# Patient Record
Sex: Female | Born: 1970 | Race: White | Hispanic: No | Marital: Single | Smoking: Never smoker
Health system: Southern US, Community
[De-identification: ages and names within clinical notes are randomized; demographics above are authoritative.]

## PROBLEM LIST (undated history)

## (undated) DIAGNOSIS — Z87442 Personal history of urinary calculi: Secondary | ICD-10-CM

## (undated) DIAGNOSIS — G43909 Migraine, unspecified, not intractable, without status migrainosus: Secondary | ICD-10-CM

## (undated) DIAGNOSIS — T8484XA Pain due to internal orthopedic prosthetic devices, implants and grafts, initial encounter: Secondary | ICD-10-CM

## (undated) DIAGNOSIS — E559 Vitamin D deficiency, unspecified: Secondary | ICD-10-CM

## (undated) DIAGNOSIS — G4730 Sleep apnea, unspecified: Secondary | ICD-10-CM

## (undated) DIAGNOSIS — E785 Hyperlipidemia, unspecified: Secondary | ICD-10-CM

## (undated) DIAGNOSIS — K219 Gastro-esophageal reflux disease without esophagitis: Secondary | ICD-10-CM

## (undated) DIAGNOSIS — S86019A Strain of unspecified Achilles tendon, initial encounter: Secondary | ICD-10-CM

## (undated) HISTORY — PX: INNER EAR SURGERY: SHX679

## (undated) HISTORY — PX: KNEE SURGERY: SHX244

## (undated) HISTORY — PX: TONSILLECTOMY: SUR1361

## (undated) HISTORY — PX: KNEE ARTHROSCOPY: SUR90

## (undated) HISTORY — DX: Migraine, unspecified, not intractable, without status migrainosus: G43.909

---
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
## 2006-07-16 DIAGNOSIS — S86019A Strain of unspecified Achilles tendon, initial encounter: Secondary | ICD-10-CM

## 2006-07-16 HISTORY — DX: Strain of unspecified achilles tendon, initial encounter: S86.019A

## 2012-06-17 DIAGNOSIS — G43109 Migraine with aura, not intractable, without status migrainosus: Secondary | ICD-10-CM | POA: Insufficient documentation

## 2012-06-17 DIAGNOSIS — Z8669 Personal history of other diseases of the nervous system and sense organs: Secondary | ICD-10-CM | POA: Insufficient documentation

## 2012-06-18 DIAGNOSIS — E785 Hyperlipidemia, unspecified: Secondary | ICD-10-CM

## 2012-06-18 HISTORY — DX: Hyperlipidemia, unspecified: E78.5

## 2013-11-13 HISTORY — PX: FOOT SURGERY: SHX648

## 2014-03-02 DIAGNOSIS — E559 Vitamin D deficiency, unspecified: Secondary | ICD-10-CM

## 2014-03-02 HISTORY — DX: Vitamin D deficiency, unspecified: E55.9

## 2015-03-30 ENCOUNTER — Other Ambulatory Visit: Payer: Self-pay | Admitting: Orthopedic Surgery

## 2015-05-18 ENCOUNTER — Encounter (HOSPITAL_BASED_OUTPATIENT_CLINIC_OR_DEPARTMENT_OTHER): Payer: Self-pay | Admitting: *Deleted

## 2015-05-19 ENCOUNTER — Ambulatory Visit (HOSPITAL_BASED_OUTPATIENT_CLINIC_OR_DEPARTMENT_OTHER): Payer: Worker's Compensation | Admitting: Certified Registered"

## 2015-05-19 ENCOUNTER — Ambulatory Visit (HOSPITAL_BASED_OUTPATIENT_CLINIC_OR_DEPARTMENT_OTHER)
Admission: RE | Admit: 2015-05-19 | Discharge: 2015-05-19 | Disposition: A | Discharge status: Home / Self Care | Payer: Worker's Compensation | Source: Ambulatory Visit | Attending: Orthopedic Surgery | Admitting: Orthopedic Surgery

## 2015-05-19 ENCOUNTER — Encounter (HOSPITAL_BASED_OUTPATIENT_CLINIC_OR_DEPARTMENT_OTHER): Payer: Self-pay | Admitting: Certified Registered"

## 2015-05-19 ENCOUNTER — Encounter (HOSPITAL_BASED_OUTPATIENT_CLINIC_OR_DEPARTMENT_OTHER)
Admission: RE | Disposition: A | Discharge status: Home / Self Care | Payer: Self-pay | Source: Ambulatory Visit | Attending: Orthopedic Surgery

## 2015-05-19 DIAGNOSIS — Z6841 Body Mass Index (BMI) 40.0 and over, adult: Secondary | ICD-10-CM | POA: Insufficient documentation

## 2015-05-19 DIAGNOSIS — T8484XA Pain due to internal orthopedic prosthetic devices, implants and grafts, initial encounter: Secondary | ICD-10-CM | POA: Insufficient documentation

## 2015-05-19 DIAGNOSIS — M25572 Pain in left ankle and joints of left foot: Secondary | ICD-10-CM

## 2015-05-19 DIAGNOSIS — Y831 Surgical operation with implant of artificial internal device as the cause of abnormal reaction of the patient, or of later complication, without mention of misadventure at the time of the procedure: Secondary | ICD-10-CM | POA: Insufficient documentation

## 2015-05-19 HISTORY — DX: Pain due to internal orthopedic prosthetic devices, implants and grafts, initial encounter: T84.84XA

## 2015-05-19 HISTORY — PX: HARDWARE REMOVAL: SHX979

## 2015-05-19 SURGERY — REMOVAL, HARDWARE
Anesthesia: General | Site: Foot | Laterality: Left

## 2015-05-19 MED ORDER — HYDROMORPHONE HCL 1 MG/ML IJ SOLN
INTRAMUSCULAR | Status: AC
  Filled 2015-05-19: qty 1

## 2015-05-19 MED ORDER — DEXAMETHASONE SODIUM PHOSPHATE 10 MG/ML IJ SOLN
INTRAMUSCULAR | Status: DC | PRN
  Administered 2015-05-19: 10 mg via INTRAVENOUS

## 2015-05-19 MED ORDER — DOCUSATE SODIUM 100 MG PO CAPS: 100.0000 mg | ORAL_CAPSULE | Freq: Two times a day (BID) | ORAL | Status: DC

## 2015-05-19 MED ORDER — CHLORHEXIDINE GLUCONATE 4 % EX LIQD: 60.0000 mL | Freq: Once | CUTANEOUS | Status: DC

## 2015-05-19 MED ORDER — PROPOFOL 10 MG/ML IV BOLUS
INTRAVENOUS | Status: DC | PRN
  Administered 2015-05-19: 200 mg via INTRAVENOUS

## 2015-05-19 MED ORDER — OXYCODONE HCL 5 MG PO TABS
ORAL_TABLET | ORAL | Status: AC
  Filled 2015-05-19: qty 1

## 2015-05-19 MED ORDER — SCOPOLAMINE 1 MG/3DAYS TD PT72: 1.0000 | MEDICATED_PATCH | Freq: Once | TRANSDERMAL | Status: DC | PRN

## 2015-05-19 MED ORDER — CEFAZOLIN SODIUM-DEXTROSE 2-3 GM-% IV SOLR
INTRAVENOUS | Status: AC
  Filled 2015-05-19: qty 50

## 2015-05-19 MED ORDER — FENTANYL CITRATE (PF) 100 MCG/2ML IJ SOLN
INTRAMUSCULAR | Status: AC
  Filled 2015-05-19: qty 4

## 2015-05-19 MED ORDER — BUPIVACAINE-EPINEPHRINE 0.5% -1:200000 IJ SOLN
INTRAMUSCULAR | Status: DC | PRN
  Administered 2015-05-19: 18 mL

## 2015-05-19 MED ORDER — DIPHENHYDRAMINE HCL 25 MG PO CAPS
25.0000 mg | ORAL_CAPSULE | Freq: Once | ORAL | Status: AC
  Administered 2015-05-19: 25 mg via ORAL

## 2015-05-19 MED ORDER — CEFAZOLIN SODIUM-DEXTROSE 2-3 GM-% IV SOLR: 2.0000 g | INTRAVENOUS | Status: DC

## 2015-05-19 MED ORDER — FENTANYL CITRATE (PF) 100 MCG/2ML IJ SOLN
50.0000 ug | INTRAMUSCULAR | Status: DC | PRN
  Administered 2015-05-19: 100 ug via INTRAVENOUS
  Administered 2015-05-19: 50 ug via INTRAVENOUS

## 2015-05-19 MED ORDER — HYDROMORPHONE HCL 1 MG/ML IJ SOLN
0.2500 mg | INTRAMUSCULAR | Status: DC | PRN
  Administered 2015-05-19 (×2): 0.5 mg via INTRAVENOUS

## 2015-05-19 MED ORDER — LIDOCAINE HCL (CARDIAC) 20 MG/ML IV SOLN
INTRAVENOUS | Status: DC | PRN
  Administered 2015-05-19: 60 mg via INTRAVENOUS

## 2015-05-19 MED ORDER — LACTATED RINGERS IV SOLN
INTRAVENOUS | Status: DC
  Administered 2015-05-19: 10:00:00 via INTRAVENOUS

## 2015-05-19 MED ORDER — CEFAZOLIN SODIUM 1-5 GM-% IV SOLN
INTRAVENOUS | Status: AC
  Filled 2015-05-19: qty 50

## 2015-05-19 MED ORDER — MIDAZOLAM HCL 2 MG/2ML IJ SOLN
1.0000 mg | INTRAMUSCULAR | Status: DC | PRN
  Administered 2015-05-19: 1 mg via INTRAVENOUS

## 2015-05-19 MED ORDER — MEPERIDINE HCL 25 MG/ML IJ SOLN: 6.2500 mg | INTRAMUSCULAR | Status: DC | PRN

## 2015-05-19 MED ORDER — SODIUM CHLORIDE 0.9 % IV SOLN: INTRAVENOUS | Status: DC

## 2015-05-19 MED ORDER — OXYCODONE HCL 5 MG/5ML PO SOLN: 5.0000 mg | Freq: Once | ORAL | Status: AC | PRN

## 2015-05-19 MED ORDER — SENNA 8.6 MG PO TABS: 2.0000 | ORAL_TABLET | Freq: Two times a day (BID) | ORAL | Status: DC

## 2015-05-19 MED ORDER — OXYCODONE HCL 5 MG PO TABS
5.0000 mg | ORAL_TABLET | Freq: Once | ORAL | Status: AC | PRN
  Administered 2015-05-19: 5 mg via ORAL

## 2015-05-19 MED ORDER — 0.9 % SODIUM CHLORIDE (POUR BTL) OPTIME
TOPICAL | Status: DC | PRN
  Administered 2015-05-19: 300 mL

## 2015-05-19 MED ORDER — MIDAZOLAM HCL 2 MG/2ML IJ SOLN
INTRAMUSCULAR | Status: AC
  Filled 2015-05-19: qty 4

## 2015-05-19 MED ORDER — HYDROCODONE-ACETAMINOPHEN 5-325 MG PO TABS: 1.0000 | ORAL_TABLET | ORAL | Status: DC | PRN

## 2015-05-19 MED ORDER — GLYCOPYRROLATE 0.2 MG/ML IJ SOLN: 0.2000 mg | Freq: Once | INTRAMUSCULAR | Status: DC | PRN

## 2015-05-19 MED ORDER — DIPHENHYDRAMINE HCL 25 MG PO CAPS
ORAL_CAPSULE | ORAL | Status: AC
  Filled 2015-05-19: qty 1

## 2015-05-19 SURGICAL SUPPLY — 65 items
APL SKNCLS STERI-STRIP NONHPOA (GAUZE/BANDAGES/DRESSINGS)
BANDAGE ELASTIC 4 VELCRO ST LF (GAUZE/BANDAGES/DRESSINGS) IMPLANT
BANDAGE ESMARK 6X9 LF (GAUZE/BANDAGES/DRESSINGS) IMPLANT
BENZOIN TINCTURE PRP APPL 2/3 (GAUZE/BANDAGES/DRESSINGS) IMPLANT
BLADE SURG 15 STRL LF DISP TIS (BLADE) ×2 IMPLANT
BLADE SURG 15 STRL SS (BLADE) ×6
BNDG CMPR 9X4 STRL LF SNTH (GAUZE/BANDAGES/DRESSINGS) ×1
BNDG CMPR 9X6 STRL LF SNTH (GAUZE/BANDAGES/DRESSINGS)
BNDG COHESIVE 4X5 TAN STRL (GAUZE/BANDAGES/DRESSINGS) ×2 IMPLANT
BNDG COHESIVE 6X5 TAN STRL LF (GAUZE/BANDAGES/DRESSINGS) IMPLANT
BNDG ESMARK 4X9 LF (GAUZE/BANDAGES/DRESSINGS) ×2 IMPLANT
BNDG ESMARK 6X9 LF (GAUZE/BANDAGES/DRESSINGS)
CHLORAPREP W/TINT 26ML (MISCELLANEOUS) ×3 IMPLANT
CLOSURE WOUND 1/2 X4 (GAUZE/BANDAGES/DRESSINGS)
COVER BACK TABLE 60X90IN (DRAPES) ×3 IMPLANT
CUFF TOURNIQUET SINGLE 34IN LL (TOURNIQUET CUFF) IMPLANT
DECANTER SPIKE VIAL GLASS SM (MISCELLANEOUS) IMPLANT
DRAPE EXTREMITY T 121X128X90 (DRAPE) ×3 IMPLANT
DRAPE OEC MINIVIEW 54X84 (DRAPES) ×2 IMPLANT
DRAPE SURG 17X23 STRL (DRAPES) IMPLANT
DRAPE U-SHAPE 47X51 STRL (DRAPES) ×3 IMPLANT
DRSG MEPITEL 4X7.2 (GAUZE/BANDAGES/DRESSINGS) ×2 IMPLANT
DRSG PAD ABDOMINAL 8X10 ST (GAUZE/BANDAGES/DRESSINGS) IMPLANT
ELECT REM PT RETURN 9FT ADLT (ELECTROSURGICAL) ×3
ELECTRODE REM PT RTRN 9FT ADLT (ELECTROSURGICAL) ×1 IMPLANT
GAUZE SPONGE 4X4 12PLY STRL (GAUZE/BANDAGES/DRESSINGS) ×3 IMPLANT
GLOVE BIO SURGEON STRL SZ8 (GLOVE) ×3 IMPLANT
GLOVE BIOGEL PI IND STRL 8 (GLOVE) ×2 IMPLANT
GLOVE BIOGEL PI INDICATOR 8 (GLOVE) ×4
GLOVE ECLIPSE 7.5 STRL STRAW (GLOVE) ×3 IMPLANT
GLOVE EXAM NITRILE MD LF STRL (GLOVE) IMPLANT
GOWN STRL REUS W/ TWL LRG LVL3 (GOWN DISPOSABLE) ×1 IMPLANT
GOWN STRL REUS W/ TWL XL LVL3 (GOWN DISPOSABLE) ×2 IMPLANT
GOWN STRL REUS W/TWL LRG LVL3 (GOWN DISPOSABLE) ×3
GOWN STRL REUS W/TWL XL LVL3 (GOWN DISPOSABLE) ×6
NEEDLE HYPO 22GX1.5 SAFETY (NEEDLE) ×2 IMPLANT
PACK BASIN DAY SURGERY FS (CUSTOM PROCEDURE TRAY) ×3 IMPLANT
PAD CAST 4YDX4 CTTN HI CHSV (CAST SUPPLIES) ×1 IMPLANT
PADDING CAST ABS 4INX4YD NS (CAST SUPPLIES)
PADDING CAST ABS COTTON 4X4 ST (CAST SUPPLIES) IMPLANT
PADDING CAST COTTON 4X4 STRL (CAST SUPPLIES) ×3
PADDING CAST COTTON 6X4 STRL (CAST SUPPLIES) IMPLANT
PENCIL BUTTON HOLSTER BLD 10FT (ELECTRODE) ×2 IMPLANT
PIN GUIDE DRILL TIP 2.8X300 (DRILL) ×2 IMPLANT
SANITIZER HAND PURELL 535ML FO (MISCELLANEOUS) ×3 IMPLANT
SHEET MEDIUM DRAPE 40X70 STRL (DRAPES) ×3 IMPLANT
SLEEVE SCD COMPRESS KNEE MED (MISCELLANEOUS) ×3 IMPLANT
SPLINT FAST PLASTER 5X30 (CAST SUPPLIES)
SPLINT PLASTER CAST FAST 5X30 (CAST SUPPLIES) IMPLANT
SPONGE LAP 18X18 X RAY DECT (DISPOSABLE) ×3 IMPLANT
STOCKINETTE 6  STRL (DRAPES) ×2
STOCKINETTE 6 STRL (DRAPES) ×1 IMPLANT
STRIP CLOSURE SKIN 1/2X4 (GAUZE/BANDAGES/DRESSINGS) IMPLANT
SUCTION FRAZIER TIP 10 FR DISP (SUCTIONS) IMPLANT
SUT ETHILON 3 0 PS 1 (SUTURE) ×2 IMPLANT
SUT MNCRL AB 3-0 PS2 18 (SUTURE) ×3 IMPLANT
SUT VIC AB 0 SH 27 (SUTURE) IMPLANT
SUT VIC AB 2-0 SH 27 (SUTURE)
SUT VIC AB 2-0 SH 27XBRD (SUTURE) IMPLANT
SYR BULB 3OZ (MISCELLANEOUS) ×3 IMPLANT
SYR CONTROL 10ML LL (SYRINGE) ×2 IMPLANT
TOWEL OR 17X24 6PK STRL BLUE (TOWEL DISPOSABLE) ×3 IMPLANT
TUBE CONNECTING 20'X1/4 (TUBING) ×1
TUBE CONNECTING 20X1/4 (TUBING) ×1 IMPLANT
UNDERPAD 30X30 (UNDERPADS AND DIAPERS) ×3 IMPLANT

## 2015-05-19 NOTE — Anesthesia Postprocedure Evaluation (Signed)
  Anesthesia Post-op Note  Patient: Stephanie Herrera  Procedure(s) Performed: Procedure(s): LEFT FOOT REMOVAL DEEP IMPLANT HARDWARE FROM CALCANEOUS AND FIRST METATARSAL (Left)  Patient Location: PACU  Anesthesia Type: General   Level of Consciousness: awake, alert  and oriented  Airway and Oxygen Therapy: Patient Spontanous Breathing  Post-op Pain: mild  Post-op Assessment: Post-op Vital signs reviewed  Post-op Vital Signs: Reviewed  Last Vitals:  Filed Vitals:   05/19/15 1408  BP: 147/85  Pulse: 63  Temp: 36.6 C  Resp: 16    Complications: No apparent anesthesia complications

## 2015-05-19 NOTE — H&P (Signed)
Stephanie Herrera is an 44 y.o. female.   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  She presents today for removal of the deep implants from the calcaneus and 1st MT.  Past Medical History  Diagnosis Date  . Painful orthopaedic hardware     left foot    Past Surgical History  Procedure Laterality Date  . Tonsillectomy    . Foot surgery Left 11-2013    by Dr Victorino DikeHewitt  . Knee surgery Right     History reviewed. No pertinent family history. Social History:  reports that she has never smoked. She does not have any smokeless tobacco history on file. She reports that she drinks alcohol. She reports that she does not use illicit drugs.  Allergies:  Allergies  Allergen Reactions  . Ivp Dye [Iodinated Diagnostic Agents] Hives    Medications Prior to Admission  Medication Sig Dispense Refill  . clindamycin (CLEOCIN) 150 MG capsule Take by mouth 3 (three) times daily.      No results found for this or any previous visit (from the past 48 hour(s)). No results found.  ROS  No recent f/c/n/v/wt loss  Blood pressure 156/73, pulse 60, temperature 98.1 F (36.7 C), temperature source Oral, resp. rate 18, height 5\' 2"  (1.575 m), weight 122.698 kg (270 lb 8 oz), SpO2 100 %. Physical Exam  wn wd woman in nad.  A and O x 4.  Mood and affect normal.  EOMI.  resp unlabored.  L foot with healed surgical incisions.  No lymphadenopathy.  5/5 strength in PF adn DF of the ankle.  Sens to LT intact about the foot.  Assessment/Plan L foot painful hardware - to OR for removal of deep implants from the calcaneus and 1st MT.  The risks and benefits of the alternative treatment options have been discussed in detail.  The patient wishes to proceed with surgery and specifically understands risks of bleeding, infection, nerve damage, blood clots, need for additional surgery, amputation and death.   Stephanie Herrera 05/19/2015, 11:30  AM

## 2015-05-19 NOTE — Brief Op Note (Signed)
05/19/2015  12:24 PM  PATIENT:  Stephanie Herrera  44 y.o. female  PRE-OPERATIVE DIAGNOSIS:  LEFT FOOT PAINFUL HARDWARE - calcaneus and 1st MT  POST-OPERATIVE DIAGNOSIS:  same  Procedure(s): 1.  LEFT FOOT REMOVAL DEEP IMPLANT FROM CALCANEUS 2.  Left foot removal of deep implant from the FIRST METATARSAL (separate incision) 3.  Left foot AP and lateral xrays  SURGEON:  Toni ArthursJohn Quianna Avery, MD  ASSISTANT: Alfredo MartinezJustin Ollis, PA-C  ANESTHESIA:   General  EBL:  minimal   TOURNIQUET:  approx 15 min with ankle esmarch  COMPLICATIONS:  None apparent  DISPOSITION:  Extubated, awake and stable to recovery.  DICTATION ID:  [id number]

## 2015-05-19 NOTE — Discharge Instructions (Addendum)
Stephanie Hewitt, MD °[facility] ° °Please read the following information regarding your care after surgery. ° °Medications  °You only need a prescription for the narcotic pain medicine (ex. oxycodone, Percocet, Norco).  All of the other medicines listed below are available over the counter. °X acetominophen (Tylenol) 650 mg every 4-6 hours as you need for minor pain °X hydrocodone as prescribed for moderate to severe pain °?  ° °Narcotic pain medicine (ex. oxycodone, Percocet, Vicodin) will cause constipation.  To prevent this problem, take the following medicines while you are taking any pain medicine. °X docusate sodium (Colace) 100 mg twice a day X senna (Senokot) 2 tablets twice a day ° ° °Weight Bearing °X Bear weight when you are able on your operated leg or foot in flat post-op shoe. ° ° °Cast / Splint / Dressing °X Remove your dressing 3 days after surgery and cover the incisions with dry dressings.   ° °After your dressing, cast or splint is removed; you may shower, but do not soak or scrub the wound.  Allow the water to run over it, and then gently pat it dry. ° °Swelling °It is normal for you to have swelling where you had surgery.  To reduce swelling and pain, keep your toes above your nose for at least 3 days after surgery.  It may be necessary to keep your foot or leg elevated for several weeks.  If it hurts, it should be elevated. ° °Follow Up °Call my office at [phone] when you are discharged from the hospital or surgery center to schedule an appointment to be seen two weeks after surgery. ° °Call my office at [phone] if you develop a fever >101.5° F, nausea, vomiting, bleeding from the surgical site or severe pain.   ° ° ° °Post Anesthesia Home Care Instructions ° °Activity: °Get plenty of rest for the remainder of the day. A responsible adult should stay with you for 24 hours following the procedure.  °For the next 24 hours, DO NOT: °-Drive a car °-Operate machinery °-Drink  alcoholic beverages °-Take any medication unless instructed by your physician °-Make any legal decisions or sign important papers. ° °Meals: °Start with liquid foods such as gelatin or soup. Progress to regular foods as tolerated. Avoid greasy, spicy, heavy foods. If nausea and/or vomiting occur, drink only clear liquids until the nausea and/or vomiting subsides. Call your physician if vomiting continues. ° °Special Instructions/Symptoms: °Your throat may feel dry or sore from the anesthesia or the breathing tube placed in your throat during surgery. If this causes discomfort, gargle with warm salt water. The discomfort should disappear within 24 hours. ° °If you had a scopolamine patch placed behind your ear for the management of post- operative nausea and/or vomiting: ° °1. The medication in the patch is effective for 72 hours, after which it should be removed.  Wrap patch in a tissue and discard in the trash. Wash hands thoroughly with soap and water. °2. You may remove the patch earlier than 72 hours if you experience unpleasant side effects which may include dry mouth, dizziness or visual disturbances. °3. Avoid touching the patch. Wash your hands with soap and water after contact with the patch. °  ° ° ° °

## 2015-05-19 NOTE — Transfer of Care (Signed)
Immediate Anesthesia Transfer of Care Note  Patient: Stephanie HeadsStephanie Herrera  Procedure(s) Performed: Procedure(s): LEFT FOOT REMOVAL DEEP IMPLANT HARDWARE FROM CALCANEOUS AND FIRST METATARSAL (Left)  Patient Location: PACU  Anesthesia Type:General  Level of Consciousness: awake, alert , oriented and patient cooperative  Airway & Oxygen Therapy: Patient Spontanous Breathing and Patient connected to face mask oxygen  Post-op Assessment: Report given to RN and Post -op Vital signs reviewed and stable  Post vital signs: Reviewed and stable  Last Vitals:  Filed Vitals:   05/19/15 0958  BP: 156/73  Pulse: 60  Temp: 36.7 C  Resp: 18    Complications: No apparent anesthesia complications

## 2015-05-19 NOTE — Anesthesia Procedure Notes (Signed)
Procedure Name: LMA Insertion Date/Time: 05/19/2015 11:56 AM Performed by: Marce Schartz D Pre-anesthesia Checklist: Patient identified, Emergency Drugs available, Suction available and Patient being monitored Patient Re-evaluated:Patient Re-evaluated prior to inductionOxygen Delivery Method: Circle System Utilized Preoxygenation: Pre-oxygenation with 100% oxygen Intubation Type: IV induction Ventilation: Mask ventilation without difficulty LMA: LMA inserted LMA Size: 4.0 Number of attempts: 1 Airway Equipment and Method: Bite block Placement Confirmation: positive ETCO2 Tube secured with: Tape Dental Injury: Teeth and Oropharynx as per pre-operative assessment

## 2015-05-19 NOTE — Anesthesia Preprocedure Evaluation (Addendum)
Anesthesia Evaluation  Patient identified by MRN, date of birth, ID band Patient awake    Reviewed: Allergy & Precautions, NPO status , Patient's Chart, lab work & pertinent test results  Airway Mallampati: I  TM Distance: >3 FB Neck ROM: Full    Dental  (+) Teeth Intact, Dental Advisory Given   Pulmonary  breath sounds clear to auscultation        Cardiovascular Rhythm:Regular Rate:Normal     Neuro/Psych    GI/Hepatic   Endo/Other  Morbid obesity  Renal/GU      Musculoskeletal   Abdominal   Peds  Hematology   Anesthesia Other Findings   Reproductive/Obstetrics                             Anesthesia Physical Anesthesia Plan  ASA: II  Anesthesia Plan: General   Post-op Pain Management:    Induction: Intravenous  Airway Management Planned: LMA  Additional Equipment:   Intra-op Plan:   Post-operative Plan: Extubation in OR  Informed Consent: I have reviewed the patients History and Physical, chart, labs and discussed the procedure including the risks, benefits and alternatives for the proposed anesthesia with the patient or authorized representative who has indicated his/her understanding and acceptance.   Dental advisory given  Plan Discussed with: CRNA, Anesthesiologist and Surgeon  Anesthesia Plan Comments:         Anesthesia Quick Evaluation  

## 2015-05-20 ENCOUNTER — Encounter (HOSPITAL_BASED_OUTPATIENT_CLINIC_OR_DEPARTMENT_OTHER): Payer: Self-pay | Admitting: Orthopedic Surgery

## 2015-05-20 NOTE — Op Note (Signed)
Stephanie Herrera, CERINO NO.:  [id number]  MEDICAL RECORD NO.:  [id number]  LOCATION:                                 FACILITY:  PHYSICIAN:  Toni Arthurs, MD             DATE OF BIRTH:  DATE OF PROCEDURE:  05/19/2015 DATE OF DISCHARGE:                              OPERATIVE REPORT   PREOPERATIVE DIAGNOSIS:  Left foot painful hardware of the calcaneus and the first metatarsal.  POSTOPERATIVE DIAGNOSIS:  Left foot painful hardware of the calcaneus and the first metatarsal.  PROCEDURE: 1. Left foot removal of deep implants from the calcaneus. 2. Left foot removal of deep implants from the first metatarsal     through a separate incision. 3. Left foot AP and lateral radiographs.  SURGEON:  Toni Arthurs, MD  ASSISTANT:  Alfredo Martinez, PA-C.  ANESTHESIA:  General.  ESTIMATED BLOOD LOSS:  Minimal.  TOURNIQUET TIME:  Approximately 15 minutes with an ankle Esmarch.  COMPLICATIONS:  None apparent.  DISPOSITION:  Extubated, awake, and stable to recovery.  INDICATIONS FOR PROCEDURE:  The patient is a 44 year old woman who underwent peroneal tendon debridement and repair as well as correction of the cavovarus foot deformity.  She has hardware remaining in the calcaneus and the first metatarsal that is painful.  She has failed nonoperative treatment and presents today for surgical removal of her painful deep implants.  She understands the risks and benefits, the alternative treatment options, and elects surgical treatment.  She specifically understands risks of bleeding, infection, nerve damage, blood clots, need for additional surgery, continued pain, amputation, and death.  PROCEDURE IN DETAIL:  After preoperative consent was obtained and the correct operative site was identified, the patient was brought to the operating room and placed supine on the operating table.  General anesthesia was induced.  Preoperative antibiotics were administered. Surgical time-out  was taken.  Left lower extremity was prepped and draped in standard sterile fashion.  Foot was exsanguinated and a 4-inch Esmarch tourniquet was wrapped around the ankle.  The patient's dorsal foot incision was identified.  It was opened again sharply and dissection was carried down through the subcutaneous tissue to the level of the plate.  The extensor hallucis longus and brevis tendons were protected.  The 2 small frag screws were removed followed by the 1/3rd tubular plate.  The wound was irrigated copiously and closed with Monocryl and nylon.  Attention was then turned to the posterior aspect of the heel where a transverse incision was identified.  The incision was again opened sharply and dissection was carried down to the head of the screw.  Soft tissues were cleared out, the guide pin was inserted into the screw. Screw was removed in its entirety without difficulty.  The wound was irrigated and closed with nylon.  Lateral and AP radiographs of the left foot were obtained showing complete removal of all metallic hardware. No acute injuries were noted.  The calcaneal and first metatarsal osteotomy sites appeared radiographically healed.  Sterile dressings were applied followed by well-padded compression wrap.  The tourniquet was released at approximately 15 minutes.  Marcaine 0.5% with epinephrine was infiltrated into the surgical sites  at the time of closure for postoperative pain control.  The patient was awakened from anesthesia and transported to the recovery room in stable condition.  FOLLOWUP PLAN:  The patient will be weightbearing as tolerated in her flat postop shoe.  She will follow up with me in 2 weeks for suture removal.  Alfredo MartinezJustin Ollis, PA-C was present and scrubbed for the duration of the case.  His assistance was essential in positioning the patient, prepping and draping, gaining and maintaining exposure, performing the operation, closing, and dressing the  wounds.  RADIOGRAPHS:  Lateral and Harris heel radiographs of the left foot were obtained intraoperatively.  These show interval removal of screws in the plate from the first metatarsal and the screw from the calcaneus.  No acute injuries are noted.  The osteotomies appeared healed.     Toni ArthursJohn Kryssa Risenhoover, MD     JH/MEDQ  D:  05/19/2015  T:  05/20/2015  Job:  [id number]

## 2016-03-16 DIAGNOSIS — Z975 Presence of (intrauterine) contraceptive device: Secondary | ICD-10-CM | POA: Insufficient documentation

## 2016-06-15 DIAGNOSIS — G4733 Obstructive sleep apnea (adult) (pediatric): Secondary | ICD-10-CM | POA: Insufficient documentation

## 2016-06-15 DIAGNOSIS — R29818 Other symptoms and signs involving the nervous system: Secondary | ICD-10-CM | POA: Insufficient documentation

## 2016-06-15 DIAGNOSIS — Z9989 Dependence on other enabling machines and devices: Secondary | ICD-10-CM | POA: Insufficient documentation

## 2019-03-10 ENCOUNTER — Other Ambulatory Visit: Payer: Self-pay

## 2019-03-10 ENCOUNTER — Encounter: Payer: Self-pay | Admitting: Family Medicine

## 2019-03-10 ENCOUNTER — Ambulatory Visit (INDEPENDENT_AMBULATORY_CARE_PROVIDER_SITE_OTHER): Payer: 59 | Admitting: Family Medicine

## 2019-03-10 VITALS — BP 136/68 | HR 89 | Temp 97.6°F | Ht 62.5 in | Wt 303.2 lb

## 2019-03-10 DIAGNOSIS — Z975 Presence of (intrauterine) contraceptive device: Secondary | ICD-10-CM

## 2019-03-10 DIAGNOSIS — Z8669 Personal history of other diseases of the nervous system and sense organs: Secondary | ICD-10-CM

## 2019-03-10 DIAGNOSIS — E7800 Pure hypercholesterolemia, unspecified: Secondary | ICD-10-CM

## 2019-03-10 DIAGNOSIS — R600 Localized edema: Secondary | ICD-10-CM

## 2019-03-10 DIAGNOSIS — Z23 Encounter for immunization: Secondary | ICD-10-CM | POA: Diagnosis not present

## 2019-03-10 DIAGNOSIS — E559 Vitamin D deficiency, unspecified: Secondary | ICD-10-CM | POA: Diagnosis not present

## 2019-03-10 DIAGNOSIS — R29818 Other symptoms and signs involving the nervous system: Secondary | ICD-10-CM

## 2019-03-10 NOTE — Progress Notes (Signed)
Stephanie Herrera is a 48 y.o. female is here for a new patient visit. She has a number of chronic medical conditions made worse by weight gain. See below for discussion.  No care team member to display   History of Present Illness:   Weight History When did you first notice that you were gaining weight? mid adult years. Did you ever gain more than 20 pounds in less than 3 months? Yes.  Life events associated with weight gain:  Father passed  []   Marriage  []   Divorce  []   Pregnancy  []   Abuse    [x]   Illness  []   Travel  []   Injury  []   Nightshift work       [x]   Job change []   Quitting smoking []   Alcohol  []   Drugs   []   Medication   Previous weight-Herrera programs: [x]   Weight Watchers [x]   Nutrisystem   []   Stephanie Herrera  [x]   Stephanie Herrera  [x]   Atkins       [x]   [employer] Herrera      []   Zone diet  []   Medifast       []   Dash diet      []   Paleo diet      [x]   HCG diet     []   Mediterranean diet  []   Ornish diet      []   Other:   What was your maximum weight Herrera? 35lbs  What are your greatest challenges with dieting? Sticking to it  Have you ever taken medication to lose weight?  [x]   Phentermine (Adipex) []   Meridia    []   Xenecal/Alli []   Phen/Fen       []   Phendimetrazine (Bontril)  [x]   Topamax  []   Saxenda   []   Diethylpropion       []   Bupropion (Wellbutrin)      []   Belviq        []   Qsymia         []   Contrave []   Other (including supplements):   Nutritional History How often do you eat breakfast? 3 days  Number of times you eat per day: five times  What beverages do you drink? Coffee water and juice Do you get up at night to eat? no List any food intolerances/restrictions: none    Food triggers (check all that apply): [x]   Stress [x]   Boredom  [x]   Anger [x]   Insomnia   [x]   Seeking reward  [x]   Parties [x]   Eating out   []   Other:   Food cravings:  []   Sugar []   Chocolate  [x]   Starches []   Salty []   Fast food []   High fat []   Large portion []    Favorite  foods:   Medical History How many minutes per day do you exercise? 20-45 min daily  How many days per week do you exercise? 4 days  Exercise type: walk at home videos  Work-related physical activity: no  Does anything limit you from exercising? Weight gain, left ankle surgery in past   How many hours do you sleep per night? 7-8 Do you feel rested in the morning? Sometimes but not normally.   Have you ever been diagnosed with an eating disorder? no []   Anorexia Nervosa  []   Bulimia Nervosa   []   Eating Disorder NOS []   Binge Eating Disorder []   Night Eating Syndrome  System Review  []   Acne    []   Skin rash    []   Cough [x]   Snoring    []   Shortness of breath  []   Chest pain []   Difficulty breathing when flat []   Fainting/Blacking out  []   Palpitations [x]   Swelling ankles/extremities []   Abdominal pain   []   Bloating []   Constipation   []   Diarrhea    []   Food intolerance []   Dysphagia/difficulty swallowing  []   Indigestion    []   Nausea/vomiting [x]   Increased appetite   []   Decreased appetite  [x]   Heartburn []   Gas and bloating   []   Urinary frequency/urgency []   Slow urine flow []   Nighttime urination   []   Blood in stools   [x]   Back pain (upper) [x]   Back pain (lower)   []   Joint pain    []   Muscle aches/pain []   Dizziness    []   Headaches   []   Seizures []   Weakness/low energy  []   Anxiety    []   Depression []   Insomnia    []   Inability to concentrate  []   Memory Herrera []   Mood changes   []   Nervousness   []   Herrera of interest []   Cold intolerance   []   Excessive sweating  []   Hair changes []   Heat intolerance   []   Blood clots    []   Fatigue/tiredness  Women only [x]   Absence of periods  [x]   Hot flashes   [x]   Facial hair []   Abnormal/excessive menstruation  Men only []   Erectile dysfunction   []   Herrera of libido    []   Hypogonadism  PMHx, SurgHx, SocialHx, Medications, and Allergies were reviewed.   Past Medical History:  Diagnosis Date  . Achilles rupture, near  complete, right 2008  . Avitaminosis D 03/02/2014  . HLD (hyperlipidemia) 06/18/2012  . Migraine   . Painful orthopaedic hardware ([facility]) removal, left foot     Past Surgical History:  Procedure Laterality Date  . FOOT SURGERY Left 11/2013   Dr Doran Durand, tendon repair and lowered arch  . HARDWARE REMOVAL Left 05/19/2015   Procedure: LEFT FOOT REMOVAL DEEP IMPLANT HARDWARE FROM CALCANEOUS AND FIRST METATARSAL;  Surgeon: Wylene Simmer, MD;  Location: [facility];  Service: Orthopedics;  Laterality: Left;  . KNEE ARTHROSCOPY Right   . TONSILLECTOMY      Family History  Problem Relation Age of Onset  . Hyperlipidemia Mother   . Lung cancer Father   . Hemophilia Father   . HIV Father        from transfusion    Current Medications and Allergies:   Current Outpatient Medications:  .  hydrochlorothiazide (HYDRODIURIL) 25 MG tablet, TAKE 1 TABLET BY MOUTH DAILY AS NEEDED, Disp: , Rfl:  .  rizatriptan (MAXALT-MLT) 5 MG disintegrating tablet, TAKE 1 TAB AT ONSET OF MIGRAINE MAY REPEAT EVERY 2 HRS. NO MORE THAN 3 TABS IN 24 HOURS, Disp: , Rfl:    Allergies  Allergen Reactions  . Hydrocodone Itching    She is able to tolerate hydromorphone   . Iodinated Diagnostic Agents Hives    Rash, Swelling, Difficulty Breathing  . Simvastatin     Jaundice   Vitals:   Vitals:   03/10/19 0741  BP: 136/68  Pulse: 89  Temp: 97.6 F (36.4 C)  TempSrc: Oral  SpO2: 98%  Weight: (!) 303 lb 3.2 oz (137.5 kg)  Height: 5' 2.5" (1.588 m)  Body mass index is 54.57 kg/m. Ideal body weight: 51.3 kg (112 lb 15.8 oz) Adjusted ideal body weight: 85.8 kg (189 lb 1.1 oz)  Physical Exam:   Physical Exam Vitals signs and nursing note reviewed.  Constitutional:      Appearance: She is obese.  HENT:     Head: Normocephalic and atraumatic.     Nose: Nose normal.     Mouth/Throat:     Mouth: Mucous membranes are moist.  Eyes:     Extraocular Movements: Extraocular movements intact.      Conjunctiva/sclera: Conjunctivae normal.  Neck:     Musculoskeletal: Normal range of motion and neck supple.  Cardiovascular:     Rate and Rhythm: Normal rate and regular rhythm.     Heart sounds: Normal heart sounds.  Pulmonary:     Effort: Pulmonary effort is normal.  Abdominal:     Palpations: Abdomen is soft.  Skin:    General: Skin is warm.     Capillary Refill: Capillary refill takes less than 2 seconds.  Neurological:     General: No focal deficit present.  Psychiatric:        Mood and Affect: Mood normal.        Behavior: Behavior normal.        Thought Content: Thought content normal.    No flowsheet data found. No results found for: CHOL, HDL, LDLCALC, LDLDIRECT, TRIG, CHOLHDL No results found for: CREATININE, BUN, NA, K, CL, CO2 No results found for: ALT, AST, GGT, ALKPHOS, BILITOT No results found for: TSH No results found for: HGBA1C No results found for: IRON, TIBC, FERRITIN No results found for: VITAMINB12 No results found for: INSULIN  Assessment and Plan:   Natallie was seen today for weight Herrera.  Diagnoses and all orders for this visit:  Pure hypercholesterolemia  Obesity, morbid, BMI 50 or higher (HCC) -     Amb Referral to Bariatric Surgery  Need for immunization against influenza -     Flu Vaccine QUAD 36+ mos IM  IUD (intrauterine device) in place  Avitaminosis D  History of migraine headaches  Suspected sleep apnea  Bilateral edema of lower extremity   I have reviewed the abnormal BMI results with the patient. In response to these results, we have agreed to the following plan:   []   Healthy weight Herrera goal of 1-2 lbs per week        [x]   Exercise options and recommendations  []   Diet options and daily caloric recommendations  [x]   Limiting high-calorie drinks  [x]   Limiting "junk food"  []   Diet diary [x]   Stress management  []   Involving friends/family in healthy lifestyle changes  []   Community weight Herrera programs  []    Nutrition consultation  []   Weight Herrera medications  [x]   Weight Herrera surgery consultation  []   Comorbidities and long-term effects of obesity  []   Smoking cessation  []   Handout given

## 2019-03-11 ENCOUNTER — Encounter: Payer: Self-pay | Admitting: Family Medicine

## 2019-03-11 NOTE — Telephone Encounter (Signed)
Added from another message.  Ok finally got some that helped and they sent me this   [email or web address]

## 2019-03-11 NOTE — Telephone Encounter (Signed)
Called patient let know that office will check insurance and she should get call from them in the next few days.

## 2019-03-16 ENCOUNTER — Encounter: Payer: Self-pay | Admitting: Family Medicine

## 2019-03-16 DIAGNOSIS — R600 Localized edema: Secondary | ICD-10-CM | POA: Insufficient documentation

## 2019-05-12 ENCOUNTER — Other Ambulatory Visit: Payer: Self-pay | Admitting: General Surgery

## 2019-05-12 ENCOUNTER — Other Ambulatory Visit (HOSPITAL_COMMUNITY): Payer: Self-pay | Admitting: General Surgery

## 2019-05-12 DIAGNOSIS — Z9189 Other specified personal risk factors, not elsewhere classified: Secondary | ICD-10-CM

## 2019-05-28 ENCOUNTER — Ambulatory Visit (HOSPITAL_COMMUNITY)
Admission: RE | Admit: 2019-05-28 | Discharge: 2019-05-28 | Disposition: A | Discharge status: Home / Self Care | Payer: 59 | Source: Ambulatory Visit | Attending: General Surgery | Admitting: General Surgery

## 2019-05-28 ENCOUNTER — Other Ambulatory Visit: Payer: Self-pay

## 2019-05-28 ENCOUNTER — Encounter: Payer: Self-pay | Admitting: Neurology

## 2019-05-28 ENCOUNTER — Ambulatory Visit (INDEPENDENT_AMBULATORY_CARE_PROVIDER_SITE_OTHER): Payer: 59 | Admitting: Neurology

## 2019-05-28 ENCOUNTER — Other Ambulatory Visit (HOSPITAL_COMMUNITY): Payer: Self-pay | Admitting: General Surgery

## 2019-05-28 VITALS — BP 110/80 | HR 88 | Temp 97.5°F | Ht 62.5 in | Wt 306.0 lb

## 2019-05-28 DIAGNOSIS — R519 Headache, unspecified: Secondary | ICD-10-CM

## 2019-05-28 DIAGNOSIS — Z9189 Other specified personal risk factors, not elsewhere classified: Secondary | ICD-10-CM | POA: Insufficient documentation

## 2019-05-28 DIAGNOSIS — G4719 Other hypersomnia: Secondary | ICD-10-CM

## 2019-05-28 DIAGNOSIS — R0683 Snoring: Secondary | ICD-10-CM

## 2019-05-28 DIAGNOSIS — R351 Nocturia: Secondary | ICD-10-CM

## 2019-05-28 DIAGNOSIS — Z6841 Body Mass Index (BMI) 40.0 and over, adult: Secondary | ICD-10-CM

## 2019-05-28 NOTE — Patient Instructions (Signed)

## 2019-05-28 NOTE — Progress Notes (Signed)
Subjective:    Patient ID: Stephanie HeadsStephanie Herrera is a 48 y.o. female.  HPI     Huston FoleySaima Salaam Battershell, MD, PhD [facility] [address], [address] [address] [place], [place] [id number]  Dear Dr. Andrey CampanileWilson, I saw your patient, Stephanie Herrera, upon your kind request in my sleep clinic today for initial consultation of her sleep disorder, in particular, concern for underlying obstructive sleep apnea. The patient is unaccompanied today. As you know, Stephanie Herrera is a 48 year old right-handed woman with an underlying medical history of migraine headaches, hyperlipidemia, vitamin D deficiency, status post right knee surgery, status post left foot surgery, and morbid obesity with a BMI of over 50, who reports snoring and excessive daytime somnolence. I reviewed your office note from 05/07/2019, which you kindly included. She is being evaluated for bariatric surgery.  Her Epworth sleepiness score is 12 out of 24 today, fatigue severity score is 49 out of 63.  She lives with her fianc.  She has 1 child, 48 yo son.  She is a non-smoker and drinks alcohol occasionally, once or twice a month, caffeine daily in the form of coffee, up to 3 or 4/day on average, she admits that she does not drink a whole lot of fluids.  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  She has been working from home since before COVID-19.  She does not wake up rested.  She tries to be in bed between 8 and 9 and rise time is around 7.  Nevertheless, she is tired during the day, she wakes up in the middle of the night sometimes with a sharp headache and sometimes in the morning.  The headache is short-lived.  She had an eye examination in February 2020 and is due next February.  She has a dog in the household, is not aware of any family history of OSA.  Had a tonsillectomy and adenoidectomy as a child as she snored loudly.  Her Past Medical History Is Significant For: Past  Medical History:  Diagnosis Date  . 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 Avitaminosis D 03/02/2014  . HLD (hyperlipidemia) 06/18/2012  . Migraine   . Painful orthopaedic hardware (HCC) removal, left foot     Her Past Surgical History Is Significant For: Past Surgical History:  Procedure Laterality Date  . FOOT SURGERY Left 11/2013   Dr Victorino DikeHewitt, tendon repair and lowered arch  . HARDWARE REMOVAL Left 05/19/2015   Procedure: LEFT FOOT REMOVAL DEEP IMPLANT HARDWARE FROM CALCANEOUS AND FIRST METATARSAL;  Surgeon: Toni ArthursJohn Hewitt, MD;  Location: [facility];  Service: Orthopedics;  Laterality: Left;  . KNEE ARTHROSCOPY Right   . TONSILLECTOMY      Her Family History Is Significant For: Family History  Problem Relation Age of Onset  . Hyperlipidemia Mother   . Lung cancer Father   . Hemophilia Father   . HIV Father        from transfusion    Her Social History Is Significant For: Social History   Socioeconomic History  . Marital status: Single    Spouse name: Not on file  . Number of children: 1  . Years of education: 6214  . Highest education level: Not on file  Occupational History  . Not on file  Social Needs  . Financial resource strain: Not on file  . Food insecurity    Worry: Not on file    Inability: Not on file  .  Transportation needs    Medical: Not on file    Non-medical: Not on file  Tobacco Use  . Smoking status: Never Smoker  . Smokeless tobacco: Never Used  Substance and Sexual Activity  . Alcohol use: Yes    Comment: social  . Drug use: No  . Sexual activity: Yes    Birth control/protection: I.U.D.  Lifestyle  . Physical activity    Days per week: Not on file    Minutes per session: Not on file  . Stress: Not on file  Relationships  . Social Musician on phone: Not on file    Gets together: Not on file    Attends religious service: Not on file    Active member of club or organization: Not on file    Attends  meetings of clubs or organizations: Not on file    Relationship status: Not on file  Other Topics Concern  . Not on file  Social History Narrative  . Not on file    Her Allergies Are:  Allergies  Allergen Reactions  . Hydrocodone Itching    She is able to tolerate hydromorphone   . Iodinated Diagnostic Agents Hives    Rash, Swelling, Difficulty Breathing  . Simvastatin     Jaundice  :   Her Current Medications Are:  Outpatient Encounter Medications as of 05/28/2019  Medication Sig  . hydrochlorothiazide (HYDRODIURIL) 25 MG tablet TAKE 1 TABLET BY MOUTH DAILY AS NEEDED  . rizatriptan (MAXALT-MLT) 5 MG disintegrating tablet TAKE 1 TAB AT ONSET OF MIGRAINE MAY REPEAT EVERY 2 HRS. NO MORE THAN 3 TABS IN 24 HOURS   No facility-administered encounter medications on file as of 05/28/2019.   :  Review of Systems:  Out of a complete 14 point review of systems, all are reviewed and negative with the exception of these symptoms as listed below: Review of Systems  Neurological:       Pt presents today to discuss her sleep. Pt has never had a sleep study but does endorse snoring.  Epworth Sleepiness Scale 0= would never doze 1= slight chance of dozing 2= moderate chance of dozing 3= high chance of dozing  Sitting and reading: 3 Watching TV: 2 Sitting inactive in a public place (ex. Theater or meeting): 2 As a passenger in a car for an hour without a break: 2 Lying down to rest in the afternoon: 1 Sitting and talking to someone: 0 Sitting quietly after lunch (no alcohol): 2 In a car, while stopped in traffic: 0 Total: 12     Objective:  Neurological Exam  Physical Exam Physical Examination:   Vitals:   05/28/19 1305  BP: 110/80  Pulse: 88  Temp: (!) 97.5 F (36.4 C)    General Examination: The patient is a very pleasant 48 y.o. female in no acute distress. She appears well-developed and well-nourished and well groomed.   HEENT: Normocephalic, atraumatic, pupils  are equal, round and reactive to light, extraocular tracking is good without limitation to gaze excursion or nystagmus noted. Hearing is grossly intact. Face is symmetric with normal facial animation. Speech is clear with no dysarthria noted. There is no hypophonia. There is no lip, neck/head, jaw or voice tremor. Neck is supple with full range of passive and active motion. There are no carotid bruits on auscultation. Oropharynx exam reveals: mild mouth dryness, adequate dental hygiene and mild airway crowding, due to Smaller airway, tonsils absent, Mallampati class I.  Tongue protrudes centrally in  palate elevates symmetrically, neck circumference is 15-7/8 inches.   Chest: Clear to auscultation without wheezing, rhonchi or crackles noted.  Heart: S1+S2+0, regular and normal without murmurs, rubs or gallops noted.   Abdomen: Soft, non-tender and non-distended with normal bowel sounds appreciated on auscultation.  Extremities: There is no pitting edema in the distal lower extremities bilaterally.   Skin: Warm and dry without trophic changes noted.   Musculoskeletal: exam reveals Left foot scar.  Neurologically:  Mental status: The patient is awake, alert and oriented in all 4 spheres. Her immediate and remote memory, attention, language skills and fund of knowledge are appropriate. There is no evidence of aphasia, agnosia, apraxia or anomia. Speech is clear with normal prosody and enunciation. Thought process is linear. Mood is normal and affect is normal.  Cranial nerves II - XII are as described above under HEENT exam.  Motor exam: Normal bulk, strength and tone is noted. There is no tremor, Romberg is negative. Reflexes are 2+ throughout. Fine motor skills and coordination: grossly intact.  Cerebellar testing: No dysmetria or intention tremor. There is no truncal or gait ataxia.  Sensory exam: intact to light touch in the upper and lower extremities.  Gait, station and balance: she stands  easily. No veering to one side is noted. No leaning to one side is noted. Posture is age-appropriate and stance is narrow based. Gait shows normal stride length and normal pace. No problems turning are noted.   Assessment and Plan:   In summary, Venie Montesinos is a very pleasant 48 y.o.-year old female  with an underlying medical history of migraine headaches, hyperlipidemia, vitamin D deficiency, status post right knee surgery, status post left foot surgery, and morbid obesity with a BMI of over 50, whose history and physical exam are concerning for obstructive sleep apnea (OSA). I had a long chat with the patient about my findings and the diagnosis of OSA, its prognosis and treatment options. We talked about medical treatments, surgical interventions and non-pharmacological approaches. I explained in particular the risks and ramifications of untreated moderate to severe OSA, especially with respect to developing cardiovascular disease down the Road, including congestive heart failure, difficult to treat hypertension, cardiac arrhythmias, or stroke. Even type 2 diabetes has, in part, been linked to untreated OSA. Symptoms of untreated OSA include daytime sleepiness, memory problems, mood irritability and mood disorder such as depression and anxiety, lack of energy, as well as recurrent headaches, especially morning headaches. We talked about trying to maintain a healthy lifestyle in general, as well as the importance of weight control. We also talked about the importance of good sleep hygiene. I recommended the following at this time: sleep study.   I explained the sleep test procedure to the patient and also outlined possible surgical and non-surgical treatment options of OSA. I also explained the CPAP and autoPAP treatment option to the patient, who indicated that she would be willing to try CPAP if the need arises. I explained the importance of being compliant with PAP treatment, not only for  insurance purposes but primarily to improve Her symptoms, and for the patient's long term health benefit, including to reduce Her cardiovascular risks. I answered all her questions today and the patient was in agreement. I plan to see her back after the sleep study is completed and encouraged her to call with any interim questions, concerns, problems or updates.   Thank you very much for allowing me to participate in the care of this nice patient. If I  can be of any further assistance to you please do not hesitate to call me at [phone].  Sincerely,   Star Age, MD, PhD

## 2019-06-02 ENCOUNTER — Encounter: Payer: Self-pay | Admitting: Dietician

## 2019-06-02 ENCOUNTER — Other Ambulatory Visit: Payer: Self-pay

## 2019-06-02 ENCOUNTER — Encounter: Payer: 59 | Attending: General Surgery | Admitting: Dietician

## 2019-06-02 NOTE — Progress Notes (Signed)
Nutrition Assessment for Bariatric Surgery Medical Nutrition Therapy  Appt Start Time: 8:10am    End Time: 8:55am  Patient was seen on 06/02/2019 for Pre-Operative Nutrition Assessment. Letter of approval faxed to [facility] bariatric surgery program coordinator on 06/02/2019.   Referral stated Supervised Weight Loss (SWL) visits needed: 0  Planned surgery: RYGB Pt expectation of surgery: to be more active again   NUTRITION ASSESSMENT   Anthropometrics  Start weight at [facility]: 302.5 lbs (date: 06/02/2019) Height: 62.5 in BMI: 54.5 kg/m2    Clinical  Medical hx: obesity, GERD, hypercholesterolemia, knee, foot, tonsillectomy Medications: hydrochlorothiazide      Lifestyle & Dietary Hx Pt lives with her fiance. 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 Very friendly, has friends who have had bariatric surgery. States she attempted to go through the bariatric surgery process twice before. States she was not significantly overweight until after her father died, prior to that was "average" size and has always been very active.   Typical meal pattern is 1 meal per day with snacks. Snacks during the day on almonds, chips, or crackers. Rarely eats breakfast, may have some fruit. Does not drink many fluids at all, may drink a cup of regular or iced coffee throughout the day. Likes meat and potatoes type of meals, but states she will eat just about anything. Doesn't particularly care for sweets. When eating out (2-3 x/week), likes to go to diner-style restaurants vs. fast food.   24-Hr Dietary Recall First Meal: grapefruit  Snack: almonds Second Meal: - Snack: cheddar crackers  Third Meal: fried potatoes Snack: - Beverages: coffee w/ creamer, iced coffee   Estimated Energy Needs Calories: 1600 Carbohydrate: 180g Protein: 100g Fat: 53g   NUTRITION DIAGNOSIS  Overweight/obesity ([place]-3.3) related to past poor dietary habits and physical inactivity as evidenced by patient w/  planned RYGB surgery following dietary guidelines for continued weight loss.    NUTRITION INTERVENTION  Nutrition counseling (C-1) and education (E-2) to facilitate bariatric surgery goals.  Pre-Op Goals Reviewed with the Patient . Track food and beverage intake (pen and paper, MyFitness Pal, Baritastic app, etc.) . Make healthy food choices while monitoring portion sizes . Consume 3 meals per day or try to eat every 3-5 hours . Avoid concentrated sugars and fried foods . Keep sugar & fat in the single digits per serving on food labels . Practice CHEWING your food (aim for applesauce consistency) . Practice not drinking 15 minutes before, during, and 30 minutes after each meal and snack . Avoid all carbonated beverages (ex: soda, sparkling beverages)  . Limit caffeinated beverages (ex: coffee, tea, energy drinks) . Avoid all sugar-sweetened beverages (ex: regular soda, sports drinks)  . Avoid alcohol  . Aim for 64-100 ounces of FLUID daily (with at least half of fluid intake being plain water)  . Aim for at least 60-80 grams of PROTEIN daily . Look for a liquid protein source that contains ?15 g protein and ?5 g carbohydrate (ex: shakes, drinks, shots) . Make a list of non-food related activities . Physical activity is an important part of a healthy lifestyle so keep it moving! The goal is to reach 150 minutes of exercise per week, including cardiovascular and weight baring activity.  Handouts Provided Include  . Bariatric Surgery handouts (Nutrition Visits, Pre-Op Goals, Protein Shakes, Vitamins & Minerals)  Learning Style & Readiness for Change Teaching method utilized: Visual & Auditory  Demonstrated degree of understanding via: Teach Back  Barriers to learning/adherence to lifestyle change:  None Identified    MONITORING & EVALUATION Dietary intake, weekly physical activity, body weight, and pre-op goals reached at next nutrition visit.   Next Steps Patient is to call [facility] to  enroll in Pre-Op Class (>2 weeks before surgery) and Post-Op Class (2 weeks after surgery) for further nutrition education when surgery date is scheduled.

## 2019-06-02 NOTE — Patient Instructions (Signed)
Start working through the [employer] discussed today, starting with the following:   Aim to eat at least 3 times per day (meal prepping!)   Aim to drink at least 64 ounces of fluid every day

## 2019-06-09 ENCOUNTER — Ambulatory Visit (INDEPENDENT_AMBULATORY_CARE_PROVIDER_SITE_OTHER): Payer: 59 | Admitting: Physician Assistant

## 2019-06-09 ENCOUNTER — Encounter: Payer: Self-pay | Admitting: Physician Assistant

## 2019-06-09 DIAGNOSIS — Z713 Dietary counseling and surveillance: Secondary | ICD-10-CM | POA: Diagnosis not present

## 2019-06-09 NOTE — Progress Notes (Signed)
Virtual Visit via Video   I connected with Stephanie Herrera on 06/09/19 at 11:20 AM EST by a video enabled telemedicine application and verified that I am speaking with the correct person using two identifiers. Location patient: Home Location provider: [facility], Office Persons participating in the virtual visit: Stephanie Herrera, [place] PA-C, Anselmo Pickler, LPN   I discussed the limitations of evaluation and management by telemedicine and the availability of in person appointments. The patient expressed understanding and agreed to proceed.  I acted as a Education administrator for [employer] Herrera, [employer], LPN  Subjective:   HPI:   Weight loss Pt would like to discuss weight loss.  She is planning for gastric sleeve vs gastric bypass, with Dr. Greer Herrera.  Tentative surgery date will be in January 2021.  Has been working on portion control. Walks 4-5 days a week, will walk about a mile total. Did find a youtube video that she has tried to walk inside during rain or inclement weather.  She recently saw dietitian, and a short, for nutrition counseling.  She was recommended to work on pushing fluids as well as start meal prepping.  Since that time she has been trying to drink 4 bottles of water a day and also she has started to boiled eggs to help with meal prep.  Drinks coffee daily, will drink sweetened creamer.  She does like to drink coffee throughout the day.  Takes HCTZ about 3 times a month for as needed swelling.  Wt Readings from Last 15 Encounters:  06/09/19 (!) 306 lb (138.8 kg)  06/02/19 (!) 302 lb 8 oz (137.2 kg)  05/28/19 (!) 306 lb (138.8 kg)  03/10/19 (!) 303 lb 3.2 oz (137.5 kg)  05/19/15 270 lb 8 oz (122.7 kg)   She has upcoming sleep study planned for Monday.  She is hopeful for this and is ready to potentially start sleeping better.  ROS: See pertinent positives and negatives per HPI.  Patient Active Problem List   Diagnosis Date Noted  .  Bilateral edema of lower extremity 03/16/2019  . Suspected sleep apnea 06/15/2016  . IUD (intrauterine device) in place 03/16/2016  . Avitaminosis D 03/02/2014  . Obesity, morbid, BMI 50 or higher ([place]) 05/18/2013  . HLD (hyperlipidemia) 06/18/2012  . History of migraine headaches 06/17/2012    Social History   Tobacco Use  . Smoking status: Never Smoker  . Smokeless tobacco: Never Used  Substance Use Topics  . Alcohol use: Yes    Comment: social    Current Outpatient Medications:  .  hydrochlorothiazide (HYDRODIURIL) 25 MG tablet, TAKE 1 TABLET BY MOUTH DAILY AS NEEDED, Disp: , Rfl:  .  rizatriptan (MAXALT-MLT) 5 MG disintegrating tablet, TAKE 1 TAB AT ONSET OF MIGRAINE MAY REPEAT EVERY 2 HRS. NO MORE THAN 3 TABS IN 24 HOURS, Disp: , Rfl:   Allergies  Allergen Reactions  . Hydrocodone Itching    She is able to tolerate hydromorphone   . Iodinated Diagnostic Agents Hives    Rash, Swelling, Difficulty Breathing  . Simvastatin     Jaundice    Objective:   VITALS: Per patient if applicable, see vitals. GENERAL: Alert, appears well and in no acute distress. HEENT: Atraumatic, conjunctiva clear, no obvious abnormalities on inspection of external nose and ears. NECK: Normal movements of the head and neck. CARDIOPULMONARY: No increased WOB. Speaking in clear sentences. I:E ratio WNL.  MS: Moves all visible extremities without noticeable abnormality. PSYCH: Pleasant and cooperative, well-groomed.  Speech normal rate and rhythm. Affect is appropriate. Insight and judgement are appropriate. Attention is focused, linear, and appropriate.  NEURO: CN grossly intact. Oriented as arrived to appointment on time with no prompting. Moves both UE equally.  SKIN: No obvious lesions, wounds, erythema, or cyanosis noted on face or hands.  Assessment and Plan:   Stephanie Herrera was seen today for discuss weight loss.  Diagnoses and all orders for this visit:  Obesity, morbid, BMI 50 or higher  (HCC)  Weight loss counseling, encounter for   Patient is extremely motivated.  Patient continues to work on dietary and activity goals per her dietitian.  We reviewed these goals and prioritize them.  Really focus on working to get more active during the weekends and also pushing volume of fluid intake.  I encouraged her to try to drink a full bottle of water prior to getting a second or third cup of coffee.  Follow-up in 1 week, sooner if any concerns.  . Reviewed expectations re: course of current medical issues. . Discussed self-management of symptoms. . Outlined signs and symptoms indicating need for more acute intervention. . Patient verbalized understanding and all questions were answered. Marland Kitchen Health Maintenance issues including appropriate healthy diet, exercise, and smoking avoidance were discussed with patient. . See orders for this visit as documented in the electronic medical record.  I discussed the assessment and treatment plan with the patient. The patient was provided an opportunity to ask questions and all were answered. The patient agreed with the plan and demonstrated an understanding of the instructions.   The patient was advised to call back or seek an in-person evaluation if the symptoms worsen or if the condition fails to improve as anticipated.   CMA or LPN served as scribe during this visit. History, Physical, and Plan performed by medical provider. The above documentation has been reviewed and is accurate and complete.   [place], [place] 06/09/2019

## 2019-06-15 ENCOUNTER — Other Ambulatory Visit: Payer: Self-pay

## 2019-06-15 ENCOUNTER — Ambulatory Visit (INDEPENDENT_AMBULATORY_CARE_PROVIDER_SITE_OTHER): Payer: 59 | Admitting: Neurology

## 2019-06-15 ENCOUNTER — Ambulatory Visit: Payer: 59 | Admitting: Physician Assistant

## 2019-06-15 DIAGNOSIS — G4733 Obstructive sleep apnea (adult) (pediatric): Secondary | ICD-10-CM

## 2019-06-15 DIAGNOSIS — G4719 Other hypersomnia: Secondary | ICD-10-CM

## 2019-06-15 DIAGNOSIS — R0683 Snoring: Secondary | ICD-10-CM

## 2019-06-15 DIAGNOSIS — R351 Nocturia: Secondary | ICD-10-CM

## 2019-06-15 DIAGNOSIS — R519 Headache, unspecified: Secondary | ICD-10-CM

## 2019-06-17 ENCOUNTER — Encounter: Payer: Self-pay | Admitting: Physician Assistant

## 2019-06-17 ENCOUNTER — Ambulatory Visit (INDEPENDENT_AMBULATORY_CARE_PROVIDER_SITE_OTHER): Payer: 59 | Admitting: Physician Assistant

## 2019-06-17 ENCOUNTER — Ambulatory Visit: Payer: 59 | Admitting: Physician Assistant

## 2019-06-17 DIAGNOSIS — Z713 Dietary counseling and surveillance: Secondary | ICD-10-CM

## 2019-06-17 NOTE — Progress Notes (Signed)
Virtual Visit via Video   I connected with Stephanie Herrera on 06/17/19 at 11:20 AM EST by a video enabled telemedicine application and verified that I am speaking with the correct person using two identifiers. Location patient: Home Location provider: [facility], Office Persons participating in the virtual visit: Stephanie Herrera, [place] PA-C, Stephanie Mull, LPN   I discussed the limitations of evaluation and management by telemedicine and the availability of in person appointments. The patient expressed understanding and agreed to proceed.  I acted as a Neurosurgeon for [employer], [employer], LPN  Subjective:   HPI:  Pt would like to discuss weight loss.  After meeting last week she has made a few positive changes to her lifestyle. She is now drinking multiple bottles of water daily. She has reduced her morning coffee consumption. She is meal prepping and eating high protein foods including: boiled eggs, nuts, and chicken breast.  Wt Readings from Last 3 Encounters:  06/17/19 (!) 306 lb (138.8 kg)  06/09/19 (!) 306 lb (138.8 kg)  06/02/19 (!) 302 lb 8 oz (137.2 kg)   She continues to work on finding time for exercise. She just dropped off her sleep study device and hopes to get her results back soon. She thinks that she is suffering from sleep apnea and that this is causing her to have less energy/motivation to exercise.  ROS: See pertinent positives and negatives per HPI.  Patient Active Problem List   Diagnosis Date Noted  . Bilateral edema of lower extremity 03/16/2019  . Suspected sleep apnea 06/15/2016  . IUD (intrauterine device) in place 03/16/2016  . Avitaminosis D 03/02/2014  . Obesity, morbid, BMI 50 or higher (HCC) 05/18/2013  . HLD (hyperlipidemia) 06/18/2012  . History of migraine headaches 06/17/2012    Social History   Tobacco Use  . Smoking status: Never Smoker  . Smokeless tobacco: Never Used  Substance Use Topics  . Alcohol  use: Yes    Comment: social    Current Outpatient Medications:  .  hydrochlorothiazide (HYDRODIURIL) 25 MG tablet, TAKE 1 TABLET BY MOUTH DAILY AS NEEDED, Disp: , Rfl:  .  rizatriptan (MAXALT-MLT) 5 MG disintegrating tablet, TAKE 1 TAB AT ONSET OF MIGRAINE MAY REPEAT EVERY 2 HRS. NO MORE THAN 3 TABS IN 24 HOURS, Disp: , Rfl:   Allergies  Allergen Reactions  . Hydrocodone Itching    She is able to tolerate hydromorphone   . Iodinated Diagnostic Agents Hives    Rash, Swelling, Difficulty Breathing  . Simvastatin     Jaundice    Objective:   VITALS: Per patient if applicable, see vitals. GENERAL: Alert, appears well and in no acute distress. HEENT: Atraumatic, conjunctiva clear, no obvious abnormalities on inspection of external nose and ears. NECK: Normal movements of the head and neck. CARDIOPULMONARY: No increased WOB. Speaking in clear sentences. I:E ratio WNL.  MS: Moves all visible extremities without noticeable abnormality. PSYCH: Pleasant and cooperative, well-groomed. Speech normal rate and rhythm. Affect is appropriate. Insight and judgement are appropriate. Attention is focused, linear, and appropriate.  NEURO: CN grossly intact. Oriented as arrived to appointment on time with no prompting. Moves both UE equally.  SKIN: No obvious lesions, wounds, erythema, or cyanosis noted on face or hands.  Assessment and Plan:   [name] was seen today for discuss weight loss.  Diagnoses and all orders for this visit:  Obesity, morbid, BMI 50 or higher (HCC)  Weight loss counseling, encounter for   Continues to  work on diet and exercise. We made the following goals today: 1. Continue to drink at least 4 bottles of water daily 2. Continue meal prepping 3. Continue adequate high protein snacks/breakfast 4. Add in at least 20 min of exercise during the week  Follow-up in 1 week.  . Reviewed expectations re: course of current medical issues. . Discussed self-management of  symptoms. . Outlined signs and symptoms indicating need for more acute intervention. . Patient verbalized understanding and all questions were answered. Marland Kitchen Health Maintenance issues including appropriate healthy diet, exercise, and smoking avoidance were discussed with patient. . See orders for this visit as documented in the electronic medical record.  I discussed the assessment and treatment plan with the patient. The patient was provided an opportunity to ask questions and all were answered. The patient agreed with the plan and demonstrated an understanding of the instructions.   The patient was advised to call back or seek an in-person evaluation if the symptoms worsen or if the condition fails to improve as anticipated.   CMA or LPN served as scribe during this visit. History, Physical, and Plan performed by medical provider. The above documentation has been reviewed and is accurate and complete.   [place], [place] 06/17/2019

## 2019-06-23 ENCOUNTER — Telehealth: Payer: Self-pay | Admitting: Neurology

## 2019-06-23 NOTE — Progress Notes (Signed)
Virtual Visit via Video   I connected with Stephanie Herrera on 06/24/19 at  8:00 AM EST by a video enabled telemedicine application and verified that I am speaking with the correct person using two identifiers. Location patient: Home Location provider: [facility], Office Persons participating in the virtual visit: [place], [place] PA-C, Anselmo Pickler, LPN   I discussed the limitations of evaluation and management by telemedicine and the availability of in person appointments. The patient expressed understanding and agreed to proceed.  I acted as a Education administrator for [employer], [employer], LPN  Subjective:   HPI:    Obesity Pt following up today on weight loss. She is trying to drink 4 bottles of water a day -- will take up until 2 AM, finish 4 bottles.  She continues to meal prep. Has not started exercising --she is feels like the best time for her to exercise is first thing in the morning, but she continues to struggle with not feeling well rested.  She is still awaiting her sleep study results that she had about a week ago.  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  Still trying to limit coffee intake.  She is trying to work on continued high-protein intake throughout the day, is planning to have 2 boiled eggs for breakfast today.  She does find that if she has high-protein snacks throughout the day that she does overall eat less during mealtimes.  Wt Readings from Last 5 Encounters:  06/24/19 (!) 307 lb (139.3 kg)  06/17/19 (!) 306 lb (138.8 kg)  06/09/19 (!) 306 lb (138.8 kg)  06/02/19 (!) 302 lb 8 oz (137.2 kg)  05/28/19 (!) 306 lb (138.8 kg)     ROS: See pertinent positives and negatives per HPI.  Patient Active Problem List   Diagnosis Date Noted  . Bilateral edema of lower extremity 03/16/2019  . Suspected sleep apnea  06/15/2016  . IUD (intrauterine device) in place 03/16/2016  . Avitaminosis D 03/02/2014  . Obesity, morbid, BMI 50 or higher ([place]) 05/18/2013  . HLD (hyperlipidemia) 06/18/2012  . History of migraine headaches 06/17/2012    Social History   Tobacco Use  . Smoking status: Never Smoker  . Smokeless tobacco: Never Used  Substance Use Topics  . Alcohol use: Yes    Comment: social    Current Outpatient Medications:  .  hydrochlorothiazide (HYDRODIURIL) 25 MG tablet, TAKE 1 TABLET BY MOUTH DAILY AS NEEDED, Disp: , Rfl:  .  rizatriptan (MAXALT-MLT) 5 MG disintegrating tablet, TAKE 1 TAB AT ONSET OF MIGRAINE MAY REPEAT EVERY 2 HRS. NO MORE THAN 3 TABS IN 24 HOURS, Disp: , Rfl:   Allergies  Allergen Reactions  . Hydrocodone Itching    She is able to tolerate hydromorphone   . Iodinated Diagnostic Agents Hives    Rash, Swelling, Difficulty Breathing  . Simvastatin     Jaundice    Objective:   VITALS: Per patient if applicable, see vitals. GENERAL: Alert, appears well and in no acute distress. HEENT: Atraumatic, conjunctiva clear, no obvious abnormalities on inspection of external nose and ears. NECK: Normal movements of the head and neck. CARDIOPULMONARY: No increased WOB. Speaking in clear sentences. I:E ratio WNL.  MS: Moves all visible extremities without noticeable abnormality. PSYCH: Pleasant and cooperative, well-groomed. Speech normal rate and rhythm. Affect is appropriate. Insight and judgement  are appropriate. Attention is focused, linear, and appropriate.  NEURO: CN grossly intact. Oriented as arrived to appointment on time with no prompting. Moves both UE equally.  SKIN: No obvious lesions, wounds, erythema, or cyanosis noted on face or hands.  Assessment and Plan:   Faithe was seen today for obesity.  Diagnoses and all orders for this visit:  Obesity, morbid, BMI 50 or higher (HCC)  Weight loss counseling, encounter for   Continue to work on adding in  exercise and ongoing hydration.  We did discuss continuing efforts with portion control, high-protein snacks.  We are going to follow-up in 1 week.  . Reviewed expectations re: course of current medical issues. . Discussed self-management of symptoms. . Outlined signs and symptoms indicating need for more acute intervention. . Patient verbalized understanding and all questions were answered. Marland Kitchen Health Maintenance issues including appropriate healthy diet, exercise, and smoking avoidance were discussed with patient. . See orders for this visit as documented in the electronic medical record.  I discussed the assessment and treatment plan with the patient. The patient was provided an opportunity to ask questions and all were answered. The patient agreed with the plan and demonstrated an understanding of the instructions.   The patient was advised to call back or seek an in-person evaluation if the symptoms worsen or if the condition fails to improve as anticipated.   CMA or LPN served as scribe during this visit. History, Physical, and Plan performed by medical provider. The above documentation has been reviewed and is accurate and complete.   [place], [place] 06/24/2019

## 2019-06-23 NOTE — Telephone Encounter (Signed)
Patient called to check on the status of her sleep study results. Please follow up.  °

## 2019-06-23 NOTE — Telephone Encounter (Signed)
I called pt and advised her that it usually takes 10-14 days for sleep study results to finalize. Pt verbalized understanding.

## 2019-06-24 ENCOUNTER — Ambulatory Visit (INDEPENDENT_AMBULATORY_CARE_PROVIDER_SITE_OTHER): Payer: 59 | Admitting: Physician Assistant

## 2019-06-24 ENCOUNTER — Encounter: Payer: Self-pay | Admitting: Physician Assistant

## 2019-06-24 ENCOUNTER — Other Ambulatory Visit: Payer: Self-pay

## 2019-06-24 DIAGNOSIS — Z713 Dietary counseling and surveillance: Secondary | ICD-10-CM | POA: Diagnosis not present

## 2019-06-25 ENCOUNTER — Telehealth: Payer: Self-pay

## 2019-06-25 NOTE — Telephone Encounter (Signed)
I called pt. I advised pt that Dr. Rexene Alberts reviewed their sleep study results and found that pt has moderate to near severe osa. Dr. Rexene Alberts recommends that pt start an auto pap at home. I reviewed PAP compliance expectations with the pt. Pt is agreeable to starting an auto-PAP. I advised pt that an order will be sent to a DME, [facility], and [facility] will call the pt within about one week after they file with the pt's insurance. [facility] will show the pt how to use the machine, fit for masks, and troubleshoot the auto-PAP if needed. A follow up appt was made for insurance purposes with Amy, NP on 08/20/2019 at 1:00pm. Pt verbalized understanding to arrive 15 minutes early and bring their auto-PAP. A letter with all of this information in it will be mailed to the pt as a reminder. I verified with the pt that the address we have on file is correct. Pt verbalized understanding of results. Pt had no questions at this time but was encouraged to call back if questions arise. I have sent the order to [facility] and have received confirmation that they have received the order.

## 2019-06-25 NOTE — Telephone Encounter (Signed)
-----   Message from Star Age, MD sent at 06/25/2019  8:07 AM EST ----- Patient referred by Dr. Redmond Pulling (she is being evaluated for Bariatric surg.), seen by me on 05/28/19, HST on 06/15/19.    Please call and notify the patient that the recent home sleep test showed obstructive sleep apnea in the moderate/near-severe range. While I recommend treatment for this in the form CPAP, her insurance will not approve a sleep study for this. They will likely only approve a trial of autoPAP, which means, that we don't have to bring her in for a sleep study with CPAP, but will let her start using a machine called autoPAP at home, through a DME company (of her choice, or as per insurance requirement). The DME representative will educate her on how to use the machine, how to put the mask on, etc. I have placed an order in the chart. Please send referral, talk to patient, send report to referring MD. We will need a FU in sleep clinic for 10 weeks post-PAP set up, please arrange that with me or one of our NPs. Thanks,   Star Age, MD, PhD [facility])

## 2019-06-25 NOTE — Procedures (Signed)
Patient Information     First Name: Stephanie Last Name: Herrera ID: [id number]  Birth Date: 12/29/1970 Age: 48 Gender: Female  Referring Provider: Gaynelle Adu, MD BMI: 54.3 (W=306 lb, H=5' 3'')  Neck Circ.:  16 '' Epworth:  12/24   Sleep Study Information    Study Date: Jun 15, 2019 S/H/A Version: [id number] / 4.1.1528 / 49  History:    48 year old woman with a history of migraine headaches, hyperlipidemia, vitamin D deficiency, status post right knee surgery, status post left foot surgery, and morbid obesity with a BMI of over 50, who reports snoring and excessive daytime somnolence. Summary & Diagnosis:     OSA  Recommendations:     This home sleep test demonstrates moderate/near-severe obstructive sleep apnea with a total AHI of 29.3/hour and O2 nadir of 78%. Treatment with positive airway pressure (in the form of CPAP) is recommended. This will require a full night CPAP titration study for proper treatment settings, O2 monitoring and mask fitting. Based on the severity of the sleep disordered breathing an attended titration study is indicated. However, patient's insurance has denied an attended sleep study; therefore, the patient will be advised to proceed with an autoPAP titration/trial at home for now. Please note that untreated obstructive sleep apnea may carry additional perioperative morbidity. Patients with significant obstructive sleep apnea should receive perioperative PAP therapy and the surgeons and particularly the anesthesiologist should be informed of the diagnosis and the severity of the sleep disordered breathing. The patient should be cautioned not to drive, work at heights, or operate dangerous or heavy equipment when tired or sleepy. Review and reiteration of good sleep hygiene measures should be pursued with any patient. Other causes of the patient's symptoms, including circadian rhythm disturbances, an underlying mood disorder, medication effect and/or an underlying medical  problem cannot be ruled out based on this test. Clinical correlation is recommended. The patient and her referring provider will be notified of the test results. The patient will be seen in follow up in sleep clinic at [facility].  I certify that I have reviewed the raw data recording prior to the issuance of this report in accordance with the standards of the American Academy of Sleep Medicine (AASM).  Huston Foley, MD, PhD [facility]) Diplomat, ABPN (Neurology and Sleep)            Sleep Summary    Oxygen Saturation Statistics     Start Study Time: End Study Time: Total Recording Time:          10:30:43 PM 6:36:56 AM         8 h, 6 min  Total Sleep Time % REM of Sleep Time:  7 h, 16 min  26.6    Mean: 92 Minimum: 78 Maximum: 99  Mean of Desaturations Nadirs (%):   88  Oxygen Desaturation. %:   4-9 10-20 >20 Total  Events Number Total    87  29 75.0 25.0  0 0.0  116 100.0  Oxygen Saturation: <90 <=88 <85 <80 <70  Duration (minutes): Sleep % 26.1 6.0 18.0 2.9 4.1 0.7 0.2 0.0 0.0 0.0     Respiratory Indices      Total Events REM NREM All Night  pRDI:  198  pAHI:  182 ODI:  116  pAHIc:  0  % CSR: 0.0 60.5 59.9 52.4 0.0 20.8 17.4 5.6 0.0 31.9 29.3 18.7 0.0       Pulse Rate Statistics during Sleep (BPM)  Mean: 71 Minimum: 53 Maximum: 114    Indices are calculated using technically valid sleep time of  6 h, 12 min. pRDI/pAHI are calculated using oxi desaturations ? 3%  Body Position Statistics  Position Supine Prone Right Left Non-Supine  Sleep (min) 356.5 0.0 9.0 71.0 80.0  Sleep % 81.7 0.0 2.1 16.3 18.3  pRDI 31.4 N/A N/A 58.4 40.5  pAHI 28.7 N/A N/A 58.4 40.5  ODI 18.3 N/A N/A 46.7 24.9     Snoring Statistics Snoring Level (dB) >40 >50 >60 >70 >80 >Threshold (45)  Sleep (min) 175.6 11.6 0.7 0.0 0.0 41.8  Sleep % 40.2 2.7 0.2 0.0 0.0 9.6    Mean: 41 dB Sleep Stages Chart                                              pAHI=29.3                                                         Mild              Moderate                    Severe                                                 5              15                    30

## 2019-06-25 NOTE — Progress Notes (Signed)
Patient referred by Dr. Redmond Pulling (she is being evaluated for Bariatric surg.), seen by me on 05/28/19, HST on 06/15/19.    Please call and notify the patient that the recent home sleep test showed obstructive sleep apnea in the moderate/near-severe range. While I recommend treatment for this in the form CPAP, her insurance will not approve a sleep study for this. They will likely only approve a trial of autoPAP, which means, that we don't have to bring her in for a sleep study with CPAP, but will let her start using a machine called autoPAP at home, through a DME company (of her choice, or as per insurance requirement). The DME representative will educate her on how to use the machine, how to put the mask on, etc. I have placed an order in the chart. Please send referral, talk to patient, send report to referring MD. We will need a FU in sleep clinic for 10 weeks post-PAP set up, please arrange that with me or one of our NPs. Thanks,   Star Age, MD, PhD [facility])

## 2019-06-25 NOTE — Addendum Note (Signed)
Addended by: Star Age on: 06/25/2019 08:07 AM   Modules accepted: Orders

## 2019-07-01 ENCOUNTER — Ambulatory Visit (INDEPENDENT_AMBULATORY_CARE_PROVIDER_SITE_OTHER): Payer: 59 | Admitting: Physician Assistant

## 2019-07-01 ENCOUNTER — Encounter: Payer: Self-pay | Admitting: Physician Assistant

## 2019-07-01 DIAGNOSIS — Z713 Dietary counseling and surveillance: Secondary | ICD-10-CM

## 2019-07-01 NOTE — Progress Notes (Signed)
Virtual Visit via Video   I connected with Stephanie Herrera on 07/01/19 at  8:40 AM EST by a video enabled telemedicine application and verified that I am speaking with the correct person using two identifiers. Location patient: Home Location provider: [facility], Office Persons participating in the virtual visit: [place], [place] PA-C, Anselmo Pickler, LPN   I discussed the limitations of evaluation and management by telemedicine and the availability of in person appointments. The patient expressed understanding and agreed to proceed.  I acted as a Education administrator for [employer], [employer], LPN  Subjective:   HPI:   Obesity Pt following up today on weight loss. She is drinking 4 bottles of water a day. She continues to meal prep. Pt has started to exercise this past weekend, walking 30 minutes a day -- did this on Saturday and Sunday.   Has appt Christmas Eve to get CPAP machine. Diagnosed with moderate to severe OSA.  Would like to work on snacking. She feels as though she has problems with portion control of snacking, as well as making good snack choices. On days she works from home, she will go into her kitchen more often than she would like to get frequent snacks.  ROS: See pertinent positives and negatives per HPI.  Patient Active Problem List   Diagnosis Date Noted  . Bilateral edema of lower extremity 03/16/2019  . Suspected sleep apnea 06/15/2016  . IUD (intrauterine device) in place 03/16/2016  . Avitaminosis D 03/02/2014  . Obesity, morbid, BMI 50 or higher ([place]) 05/18/2013  . HLD (hyperlipidemia) 06/18/2012  . History of migraine headaches 06/17/2012    Social History   Tobacco Use  . Smoking status: Never Smoker  . Smokeless tobacco: Never Used  Substance Use Topics  . Alcohol use: Yes    Comment: social    Current Outpatient Medications:  .  hydrochlorothiazide (HYDRODIURIL) 25 MG tablet, TAKE 1 TABLET BY MOUTH DAILY AS NEEDED,  Disp: , Rfl:  .  rizatriptan (MAXALT-MLT) 5 MG disintegrating tablet, TAKE 1 TAB AT ONSET OF MIGRAINE MAY REPEAT EVERY 2 HRS. NO MORE THAN 3 TABS IN 24 HOURS, Disp: , Rfl:   Allergies  Allergen Reactions  . Hydrocodone Itching    She is able to tolerate hydromorphone   . Iodinated Diagnostic Agents Hives    Rash, Swelling, Difficulty Breathing  . Simvastatin     Jaundice    Objective:   VITALS: Per patient if applicable, see vitals. GENERAL: Alert, appears well and in no acute distress. HEENT: Atraumatic, conjunctiva clear, no obvious abnormalities on inspection of external nose and ears. NECK: Normal movements of the head and neck. CARDIOPULMONARY: No increased WOB. Speaking in clear sentences. I:E ratio WNL.  MS: Moves all visible extremities without noticeable abnormality. PSYCH: Pleasant and cooperative, well-groomed. Speech normal rate and rhythm. Affect is appropriate. Insight and judgement are appropriate. Attention is focused, linear, and appropriate.  NEURO: CN grossly intact. Oriented as arrived to appointment on time with no prompting. Moves both UE equally.  SKIN: No obvious lesions, wounds, erythema, or cyanosis noted on face or hands.  Assessment and Plan:   Cimberly was seen today for obesity.  Diagnoses and all orders for this visit:  Obesity, morbid, BMI 50 or higher ([place])  Weight loss counseling, encounter for   Continues to progress towards her goals. Provided emotional support. Encouraged continued exercise and water consumption. We made two specific goals today: 1. Make a list of snacks that  you are consuming throughout the day for at least ONE day 2. Start drinking at least one protein shake a day  Follow-up in one week, sooner if concerns.  . Reviewed expectations re: course of current medical issues. . Discussed self-management of symptoms. . Outlined signs and symptoms indicating need for more acute intervention. . Patient verbalized  understanding and all questions were answered. Marland Kitchen Health Maintenance issues including appropriate healthy diet, exercise, and smoking avoidance were discussed with patient. . See orders for this visit as documented in the electronic medical record.  I discussed the assessment and treatment plan with the patient. The patient was provided an opportunity to ask questions and all were answered. The patient agreed with the plan and demonstrated an understanding of the instructions.   The patient was advised to call back or seek an in-person evaluation if the symptoms worsen or if the condition fails to improve as anticipated.   CMA or LPN served as scribe during this visit. History, Physical, and Plan performed by medical provider. The above documentation has been reviewed and is accurate and complete.  I spent 25 minutes with this patient, greater than 50% was face-to-face time counseling regarding the above diagnoses.   [place], [place] 07/01/2019

## 2019-07-08 ENCOUNTER — Ambulatory Visit: Payer: 59 | Admitting: Physician Assistant

## 2019-07-08 ENCOUNTER — Encounter: Payer: Self-pay | Admitting: Physician Assistant

## 2019-07-08 ENCOUNTER — Ambulatory Visit (INDEPENDENT_AMBULATORY_CARE_PROVIDER_SITE_OTHER): Payer: 59 | Admitting: Physician Assistant

## 2019-07-08 DIAGNOSIS — Z713 Dietary counseling and surveillance: Secondary | ICD-10-CM | POA: Diagnosis not present

## 2019-07-08 NOTE — Progress Notes (Signed)
Stephanie Herrera is a 48 y.o. female here for a new problem.  I,Kaybree Williams,acting as a Neurosurgeon for [employer], PA.,have documented all relevant documentation on the behalf of Stephanie Motto, PA,as directed by  Stephanie Motto, PA while in the presence of Stephanie Herrera, Stephanie Herrera.   History of Present Illness:   Chief Complaint  Patient presents with  . Follow-up    HPI   Usual eating pattern includes: The patient eats a regular, healthy diet.. Usual physical activity includes doing aerobic activity 30 minutes a day 2 days per week. Current life stressors: working from home.  She did track what she was eating for me, as suggested at our last visit. Saturday --> water, spicy chicken sandwich (2p), water, broiled flounder, salad, baked potato (7p) --> did do 30 minutes Monday --> bowl of honey nut cheerios, 4-5 bottles of water, Better Cheddar crackers, roast beef sandwich and potatoes Tuesday --> 2 cutie oranges, grapefruit slices; water; pizza (BBQ chicken) - 4 slices  She has not tried any protein shakes as we discussed at our last visit.   Past Medical History:  Diagnosis Date  . 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 Avitaminosis D 03/02/2014  . HLD (hyperlipidemia) 06/18/2012  . Migraine   . Painful orthopaedic hardware (HCC) removal, left foot      Social History   Socioeconomic History  . Marital status: Single    Spouse name: Not on file  . Number of children: 1  . Years of education: 13  . Highest education level: Not on file  Occupational History  . Not on file  Tobacco Use  . Smoking status: Never Smoker  . Smokeless tobacco: Never Used  Substance and Sexual Activity  . Alcohol use: Yes    Comment: social  . Drug use: No  . Sexual activity: Yes    Birth control/protection: I.U.D.  Other Topics Concern  . Not on file  Social History Narrative  . Not on file   Social Determinants of Health   Financial Resource Strain:   . Difficulty of  Paying Living Expenses: Not on file  Food Insecurity:   . Worried About Programme researcher, broadcasting/film/video in the Last Year: Not on file  . Ran Out of Food in the Last Year: Not on file  Transportation Needs:   . Lack of Transportation (Medical): Not on file  . Lack of Transportation (Non-Medical): Not on file  Physical Activity:   . Days of Exercise per Week: Not on file  . Minutes of Exercise per Session: Not on file  Stress:   . Feeling of Stress : Not on file  Social Connections:   . Frequency of Communication with Friends and Family: Not on file  . Frequency of Social Gatherings with Friends and Family: Not on file  . Attends Religious Services: Not on file  . Active Member of Clubs or Organizations: Not on file  . Attends Banker Meetings: Not on file  . Marital Status: Not on file  Intimate Partner Violence:   . Fear of Current or Ex-Partner: Not on file  . Emotionally Abused: Not on file  . Physically Abused: Not on file  . Sexually Abused: Not on file    Past Surgical History:  Procedure Laterality Date  . FOOT SURGERY Left 11/2013   Dr Victorino Dike, tendon repair and lowered arch  . HARDWARE REMOVAL Left 05/19/2015   Procedure: LEFT FOOT REMOVAL DEEP IMPLANT HARDWARE FROM CALCANEOUS AND FIRST METATARSAL;  Surgeon: Wylene Simmer, MD;  Location: [facility];  Service: Orthopedics;  Laterality: Left;  . KNEE ARTHROSCOPY Right   . TONSILLECTOMY      Family History  Problem Relation Age of Onset  . Hyperlipidemia Mother   . Lung cancer Father   . Hemophilia Father   . HIV Father        from transfusion    Allergies  Allergen Reactions  . Hydrocodone Itching    She is able to tolerate hydromorphone   . Iodinated Diagnostic Agents Hives    Rash, Swelling, Difficulty Breathing  . Simvastatin     Jaundice    Current Medications:   Current Outpatient Medications:  .  hydrochlorothiazide (HYDRODIURIL) 25 MG tablet, TAKE 1 TABLET BY MOUTH DAILY AS NEEDED,  Disp: , Rfl:  .  rizatriptan (MAXALT-MLT) 5 MG disintegrating tablet, TAKE 1 TAB AT ONSET OF MIGRAINE MAY REPEAT EVERY 2 HRS. NO MORE THAN 3 TABS IN 24 HOURS, Disp: , Rfl:    Review of Systems:   Review of Systems  Constitutional: Negative.   HENT: Negative.   Eyes: Negative.   Respiratory: Negative.   Cardiovascular: Negative.   Gastrointestinal: Positive for heartburn.       OTC medications help with symptoms. Notices that it is related to what she eats.   Genitourinary: Negative.   Musculoskeletal: Negative.   Skin: Negative.   Neurological: Negative.   Endo/Heme/Allergies: Negative.   Psychiatric/Behavioral: Negative.       Vitals:   Vitals:   07/08/19 0748  Temp: (!) 97.4 F (36.3 C)  TempSrc: Temporal  Weight: (!) 303 lb (137.4 kg)  Height: 5\' 2"  (1.575 m)     Body mass index is 55.42 kg/m.  Physical Exam:   Physical Exam Constitutional:      Appearance: She is well-developed.  HENT:     Head: Normocephalic and atraumatic.  Eyes:     Conjunctiva/sclera: Conjunctivae normal.  Pulmonary:     Effort: Pulmonary effort is normal.  Musculoskeletal:        General: Normal range of motion.     Cervical back: Normal range of motion and neck supple.  Skin:    General: Skin is warm and dry.  Neurological:     Mental Status: She is alert and oriented to person, place, and time.  Psychiatric:        Behavior: Behavior normal.        Thought Content: Thought content normal.        Judgment: Judgment normal.     No results found for this or any previous visit.  Assessment and Plan:   Stephanie Herrera was seen today for follow-up.  Diagnoses and all orders for this visit:  Obesity, morbid, BMI 50 or higher ([place])  Weight loss counseling, encounter for   Provided emotional support. Goals discussed today: 1. Protein with each meal/snack 2. Try to incorporate a protein-rich smoothie when not able to eat regular meal   . Reviewed expectations re: course of  current medical issues. . Discussed self-management of symptoms. . Outlined signs and symptoms indicating need for more acute intervention. . Patient verbalized understanding and all questions were answered. . See orders for this visit as documented in the electronic medical record. . Patient received an After-Visit Summary.  CMA or LPN served as scribe during this visit. History, Physical, and Plan performed by medical provider. The above documentation has been reviewed and is accurate and complete.  I spent 25 minutes with  this patient, greater than 50% was face-to-face time counseling regarding the above diagnoses.  Stephanie MottoSamantha Chava Dulac, PA-C

## 2019-07-22 ENCOUNTER — Ambulatory Visit (INDEPENDENT_AMBULATORY_CARE_PROVIDER_SITE_OTHER): Payer: 59 | Admitting: Physician Assistant

## 2019-07-22 ENCOUNTER — Encounter: Payer: Self-pay | Admitting: Physician Assistant

## 2019-07-22 VITALS — Ht 62.0 in | Wt 305.0 lb

## 2019-07-22 DIAGNOSIS — R0981 Nasal congestion: Secondary | ICD-10-CM | POA: Diagnosis not present

## 2019-07-22 NOTE — Progress Notes (Signed)
Virtual Visit via Video   I connected with Stephanie Herrera on 07/22/19 at  8:00 AM EST by a video enabled telemedicine application and verified that I am speaking with the correct person using two identifiers. Location patient: Home Location provider: [facility], Office Persons participating in the virtual visit: Elivia, Robotham PA-C  I discussed the limitations of evaluation and management by telemedicine and the availability of in person appointments. The patient expressed understanding and agreed to proceed.  Subjective:   HPI:    Sinus congestion Symptom onset: Friday 07/17/2019  Travel/contacts: Pt found out Thurs her sister was positive for COVID and they were together over the holiday. No travel  Patient endorses the following symptoms: subjective fever, sinus headache, sinus congestion, itchy watery eyes, ear fullness, sore throat, dry cough (non-productive) and myalgia, chest tightness  Patient denies the following symptoms: rhinorrhea, red eyes and difficulty swallowing; SOB  Treatments tried: Sudafed -- helped  Pt went and got tested yesterday for COVID no result yet -- hoping to know by Thursday (tomorrow)  Patient risk factors: Current [id number] risk of complications score: 1 Smoking status: Stephanie Herrera  reports that she has never smoked. She has never used smokeless tobacco. If female, currently pregnant? []   Yes [x]   No  Obesity She is continuing to work on healthy snacks and increased water intake as able.   ROS: See pertinent positives and negatives per HPI.  Patient Active Problem List   Diagnosis Date Noted  . Bilateral edema of lower extremity 03/16/2019  . Suspected sleep apnea 06/15/2016  . IUD (intrauterine device) in place 03/16/2016  . Avitaminosis D 03/02/2014  . Obesity, morbid, BMI 50 or higher ([place]) 05/18/2013  . HLD (hyperlipidemia) 06/18/2012  . History of migraine headaches 06/17/2012    Social History     Tobacco Use  . Smoking status: Never Smoker  . Smokeless tobacco: Never Used  Substance Use Topics  . Alcohol use: Yes    Comment: social    Current Outpatient Medications:  .  hydrochlorothiazide (HYDRODIURIL) 25 MG tablet, TAKE 1 TABLET BY MOUTH DAILY AS NEEDED, Disp: , Rfl:  .  rizatriptan (MAXALT-MLT) 5 MG disintegrating tablet, TAKE 1 TAB AT ONSET OF MIGRAINE MAY REPEAT EVERY 2 HRS. NO MORE THAN 3 TABS IN 24 HOURS, Disp: , Rfl:   Allergies  Allergen Reactions  . Hydrocodone Itching    She is able to tolerate hydromorphone   . Iodinated Diagnostic Agents Hives    Rash, Swelling, Difficulty Breathing  . Simvastatin     Jaundice    Objective:   VITALS: Per patient if applicable, see vitals. GENERAL: Alert, appears well and in no acute distress. HEENT: Atraumatic, conjunctiva clear, no obvious abnormalities on inspection of external nose and ears. NECK: Normal movements of the head and neck. CARDIOPULMONARY: No increased WOB. Speaking in clear sentences. I:E ratio WNL.  MS: Moves all visible extremities without noticeable abnormality. PSYCH: Pleasant and cooperative, well-groomed. Speech normal rate and rhythm. Affect is appropriate. Insight and judgement are appropriate. Attention is focused, linear, and appropriate.  NEURO: CN grossly intact. Oriented as arrived to appointment on time with no prompting. Moves both UE equally.  SKIN: No obvious lesions, wounds, erythema, or cyanosis noted on face or hands.  Assessment and Plan:   Ilaisaane was seen today for covid symptoms and obesity.  Diagnoses and all orders for this visit:  Sinus congestion Patient has a respiratory illness without signs of acute distress or respiratory compromise  at this time. This is likely a viral infection, which can come from a number of respiratory viruses. As a precaution, they have been advised to remain home until COVID-19 results and then possible further quarantine after that based on  results and symptoms. Advised if they experience a "second sickening" or worsening symptoms as the illness progresses, they are to call the office for further instructions or seek emergent evaluation for any severe symptoms.   Obesity, morbid, BMI 50 or higher (HCC) Continue to work on small, frequent nutrition meals and adequate water intake.  . Reviewed expectations re: course of current medical issues. . Discussed self-management of symptoms. . Outlined signs and symptoms indicating need for more acute intervention. . Patient verbalized understanding and all questions were answered. Marland Kitchen Health Maintenance issues including appropriate healthy diet, exercise, and smoking avoidance were discussed with patient. . See orders for this visit as documented in the electronic medical record.  I discussed the assessment and treatment plan with the patient. The patient was provided an opportunity to ask questions and all were answered. The patient agreed with the plan and demonstrated an understanding of the instructions.   The patient was advised to call back or seek an in-person evaluation if the symptoms worsen or if the condition fails to improve as anticipated.   CMA or LPN served as scribe during this visit. History, Physical, and Plan performed by medical provider. The above documentation has been reviewed and is accurate and complete.  [place], [place] 07/22/2019

## 2019-07-23 ENCOUNTER — Telehealth: Payer: 59 | Admitting: Physician Assistant

## 2019-07-23 DIAGNOSIS — Z20822 Contact with and (suspected) exposure to covid-19: Secondary | ICD-10-CM | POA: Diagnosis not present

## 2019-07-23 MED ORDER — FLUTICASONE PROPIONATE 50 MCG/ACT NA SUSP: 2.0000 | Freq: Every day | NASAL | 0 refills | Status: DC

## 2019-07-23 MED ORDER — BENZONATATE 100 MG PO CAPS: 100.0000 mg | ORAL_CAPSULE | Freq: Three times a day (TID) | ORAL | 0 refills | Status: DC | PRN

## 2019-07-23 NOTE — Progress Notes (Signed)
I have spent 5 minutes in review of e-visit questionnaire, review and updating patient chart, medical decision making and response to patient.   Onie Kasparek Cody Noma Quijas, PA-C    

## 2019-07-23 NOTE — Progress Notes (Signed)
E-Visit for Corona Virus Screening   Your current symptoms could be consistent with the coronavirus. I am glad that you have gone for testing. Make sure to quarantine until results are in.   We are enrolling you in our MyChart Home Monitoring for COVID19 . Daily you will receive a questionnaire within the MyChart website. Our COVID 19 response team will be monitoring your responses daily.  I am going to send in a cough medication to help calm this down. Make sure to start some OTC Mucinex to thin out the congestion you are experiencing. I will also send in a nasal steroid to help with your sinus pressure and other nasal symptoms.   Testing Information: The COVID-19 Community Testing sites will begin testing BY APPOINTMENT ONLY.  You can schedule online at [email or web address]  If you do not have access to a smart phone or computer you may call [phone] for an appointment.  Testing Locations: Appointment schedule is 8 am to 3:30 pm at all sites  [facility] indoors at [address] [place] [address] [facility]  indoors at [facility] [address] [place], [place] [address] [place] indoors at [address] [place] [address]  Additional testing sites in the Community:  . For [facility] Testing sites in [facility]  FarmerBuys.com.au  . For Pop-up testing sites in [place]  [email or web address]-[email or web address].[email or web address]  . For Testing sites with regular hours [email or web address]://[email or web address]  . For [facility] [email or web address]  . For [facility] EternalVitamin.dk  . For [facility] testing in [place] and [employer]  EternalVitamin.dk  . For [facility] testing in [facility]   [email or web address].[email or web address]  For  more information about community testing call 5160571136   We are enrolling you in our MyChart Home Monitoring for COVID19 . Daily you will receive a questionnaire within the MyChart website. Our COVID 19 response team will be monitoring your responses daily.  Please quarantine yourself while awaiting your test results. If you develop fever/cough/breathlessness, please stay home for 10 days with improving symptoms and until you have had 24 hours of no fever (without taking a fever reducer).  You should wear a mask or cloth face covering over your nose and mouth if you must be around other people or animals, including pets (even at home). Try to stay at least 6 feet away from other people. This will protect the people around you.  Please continue good preventive care measures, including:  frequent hand-washing, avoid touching your face, cover coughs/sneezes, stay out of crowds and keep a 6 foot distance from others.  COVID-19 is a respiratory illness with symptoms that are similar to the flu. Symptoms are typically mild to moderate, but there have been cases of severe illness and death due to the virus.   The following symptoms may appear 2-14 days after exposure: . Fever . Cough . Shortness of breath or difficulty breathing . Chills . Repeated shaking with chills . Muscle pain . Headache . Sore throat . New loss of taste or smell . Fatigue . Congestion or runny nose . Nausea or vomiting . Diarrhea  Go to the nearest hospital ED for assessment if fever/cough/breathlessness are severe or illness seems like a threat to life.  It is vitally important that if you feel that you have an infection such as this virus or any other virus that you stay home and away from places where you may spread it to  others.  You should avoid contact  with people age 49 and older.    You may also take acetaminophen (Tylenol) as needed for fever.  Reduce your risk of any infection by using the same precautions used for avoiding the common cold or flu:  Marland Kitchen Wash your hands often with soap and warm water for at least 20 seconds.  If soap and water are not readily available, use an alcohol-based hand sanitizer with at least 60% alcohol.  . If coughing or sneezing, cover your mouth and nose by coughing or sneezing into the elbow areas of your shirt or coat, into a tissue or into your sleeve (not your hands). . Avoid shaking hands with others and consider head nods or verbal greetings only. . Avoid touching your eyes, nose, or mouth with unwashed hands.  . Avoid close contact with people who are sick. . Avoid places or events with large numbers of people in one location, like concerts or sporting events. . Carefully consider travel plans you have or are making. . If you are planning any travel outside or inside the [place], visit the CDC's Travelers' Health webpage for the latest health notices. . If you have some symptoms but not all symptoms, continue to monitor at home and seek medical attention if your symptoms worsen. . If you are having a medical emergency, call 911.  HOME CARE . Only take medications as instructed by your medical team. . Drink plenty of fluids and get plenty of rest. . A steam or ultrasonic humidifier can help if you have congestion.   GET HELP RIGHT AWAY IF YOU HAVE EMERGENCY WARNING SIGNS** FOR COVID-19. If you or someone is showing any of these signs seek emergency medical care immediately. Call 911 or proceed to your closest emergency facility if: . You develop worsening high fever. . Trouble breathing . Bluish lips or face . Persistent pain or pressure in the chest . New confusion . Inability to wake or stay awake . You cough up blood. . Your symptoms become more severe  **This list  is not all possible symptoms. Contact your medical provider for any symptoms that are sever or concerning to you.  MAKE SURE YOU   Understand these instructions.  Will watch your condition.  Will get help right away if you are not doing well or get worse.  Your e-visit answers were reviewed by a board certified advanced clinical practitioner to complete your personal care plan.  Depending on the condition, your plan could have included both over the counter or prescription medications.  If there is a problem please reply once you have received a response from your provider.  Your safety is important to [place].  If you have drug allergies check your prescription carefully.    You can use MyChart to ask questions about today's visit, request a non-urgent call back, or ask for a work or school excuse for 24 hours related to this e-Visit. If it has been greater than 24 hours you will need to follow up with your provider, or enter a new e-Visit to address those concerns. You will get an e-mail in the next two days asking about your experience.  I hope that your e-visit has been valuable and will speed your recovery. Thank you for using e-visits.

## 2019-07-24 ENCOUNTER — Encounter: Payer: Self-pay | Admitting: Physician Assistant

## 2019-08-06 ENCOUNTER — Telehealth: Payer: Self-pay | Admitting: Family Medicine

## 2019-08-06 NOTE — Telephone Encounter (Signed)
Called pt to r/s her 2/4 appt due to NP being out- unable to contact no VM set up

## 2019-08-07 ENCOUNTER — Encounter: Payer: Self-pay | Admitting: Family Medicine

## 2019-08-07 NOTE — Telephone Encounter (Signed)
Called x2 to r/s 2/4 no VM set up c/a letter sent

## 2019-08-15 ENCOUNTER — Other Ambulatory Visit: Payer: Self-pay | Admitting: Physician Assistant

## 2019-08-20 ENCOUNTER — Ambulatory Visit: Payer: Self-pay | Admitting: Family Medicine

## 2019-09-15 LAB — TSH: TSH: 4.7 (ref 0.41–5.90)

## 2019-09-15 LAB — BASIC METABOLIC PANEL
BUN: 14 (ref 4–21)
CO2: 29 — AB (ref 13–22)
Chloride: 102 (ref 99–108)
Creatinine: 0.8 (ref 0.5–1.1)
Glucose: 93
Potassium: 4.3 (ref 3.4–5.3)
Sodium: 139 (ref 137–147)

## 2019-09-15 LAB — CBC AND DIFFERENTIAL
HCT: 42 (ref 36–46)
Hemoglobin: 14.2 (ref 12.0–16.0)
Neutrophils Absolute: 3707
Platelets: 250 (ref 150–399)
WBC: 6.8

## 2019-09-15 LAB — CBC: RBC: 4.3 (ref 3.87–5.11)

## 2019-09-15 LAB — VITAMIN D 25 HYDROXY (VIT D DEFICIENCY, FRACTURES): Vit D, 25-Hydroxy: 40

## 2019-09-15 LAB — COMPREHENSIVE METABOLIC PANEL
Albumin: 4.4 (ref 3.5–5.0)
Calcium: 9.3 (ref 8.7–10.7)
GFR calc non Af Amer: 89
Globulin: 2.5

## 2019-09-15 LAB — HEPATIC FUNCTION PANEL
ALT: 28 (ref 7–35)
AST: 20 (ref 13–35)
Alkaline Phosphatase: 73 (ref 25–125)
Bilirubin, Total: 0.6

## 2019-09-15 LAB — LIPID PANEL
Cholesterol: 327 — AB (ref 0–200)
HDL: 48 (ref 35–70)
LDL Cholesterol: 250
Triglycerides: 134 (ref 40–160)

## 2019-09-15 LAB — HEMOGLOBIN A1C: Hemoglobin A1C: 5.3

## 2019-09-15 LAB — VITAMIN B12: Vitamin B-12: 383

## 2019-09-23 ENCOUNTER — Encounter: Payer: Self-pay | Admitting: Physician Assistant

## 2019-09-23 LAB — FOLATE: Folate: 9.9

## 2019-10-05 ENCOUNTER — Encounter: Payer: 59 | Attending: General Surgery | Admitting: Skilled Nursing Facility1

## 2019-10-05 ENCOUNTER — Other Ambulatory Visit: Payer: Self-pay

## 2019-10-05 DIAGNOSIS — E669 Obesity, unspecified: Secondary | ICD-10-CM | POA: Insufficient documentation

## 2019-10-05 NOTE — Progress Notes (Signed)
Pre-Operative Nutrition Class:  Appt start time: 9150   End time:  1830.  Patient was seen on 10/05/2019 for Pre-Operative Bariatric Surgery Education at [facility].   Surgery date:  Surgery type: RYGB Start weight at [facility]: 302.5 Weight today: 215.6  Samples given per MNT protocol. Patient educated on appropriate usage: Bariatric Advantage Multivitamin Lot #[id number] Exp:08/21  Bariatric Advantage Calcium  Lot #[id number] Exp:07/18/2020  Protein Shake Lot #[id number] Exp:11/30/2020  The following the learning objectives were met by the patient during this course:  Identify Pre-Op Dietary Goals and will begin 2 weeks pre-operatively  Identify appropriate sources of fluids and proteins   State protein recommendations and appropriate sources pre and post-operatively  Identify Post-Operative Dietary Goals and will follow for 2 weeks post-operatively  Identify appropriate multivitamin and calcium sources  Describe the need for physical activity post-operatively and will follow MD recommendations  State when to call healthcare provider regarding medication questions or post-operative complications  Handouts given during class include:  Pre-Op Bariatric Surgery Diet Handout  Protein Shake Handout  Post-Op Bariatric Surgery Nutrition Handout  BELT Program Information Flyer  Support Group Information Flyer  [facility] Bariatric Supplements Price List  Follow-Up Plan: Patient will follow-up at [facility] 2 weeks post operatively for diet advancement per MD.

## 2019-11-02 ENCOUNTER — Ambulatory Visit: Payer: Self-pay | Admitting: General Surgery

## 2019-11-03 NOTE — Progress Notes (Signed)
PCP - Jarold Motto PA Cardiologist -   Chest x-ray -  EKG -  Stress Test -  ECHO -  Cardiac Cath -   Sleep Study -  CPAP - yes  Fasting Blood Sugar -  Checks Blood Sugar _____ times a day  Blood Thinner Instructions: Aspirin Instructions: Last Dose:  Anesthesia review: osa cpap, abn ekg  Patient denies shortness of breath, fever, cough and chest pain at PAT appointment   none   Patient verbalized understanding of instructions that were given to them at the PAT appointment. Patient was also instructed that they will need to review over the PAT instructions again at home before surgery.

## 2019-11-03 NOTE — Patient Instructions (Addendum)
DUE TO COVID-19 ONLY ONE VISITOR IS ALLOWED TO COME WITH YOU AND STAY IN THE WAITING ROOM ONLY DURING PRE OP AND PROCEDURE DAY OF SURGERY. TWO  VISITOR MAY VISIT WITH YOU AFTER SURGERY IN YOUR PRIVATE ROOM DURING VISITING HOURS ONLY!  10-a8p  YOU NEED TO HAVE A COVID 19 TEST ON___4-22-21____ @ __1125 am _____, THIS TEST MUST BE DONE BEFORE SURGERY, COME  [address], [place] , [phone].  [facility]) ONCE YOUR COVID TEST IS COMPLETED, PLEASE BEGIN THE QUARANTINE INSTRUCTIONS AS OUTLINED IN YOUR HANDOUT.                Stephanie Herrera  11/03/2019   Your procedure is scheduled on: 11-09-19   [facility]   Report to admitting at    0820   AM      Bring cpap mask and tubing only     Call this number if you have problems the morning of surgery [phone]    Remember: MORNING OF SURGERY DRINK:   DRINK 1 G2 drink BEFORE YOU LEAVE HOME, DRINK ALL OF THE  G2 DRINK AT ONE TIME.   NO SOLID FOOD AFTER 600 PM THE NIGHT BEFORE YOUR SURGERY. YOU MAY DRINK CLEAR FLUIDS. THE G2 DRINK YOU DRINK BEFORE YOU LEAVE HOME WILL BE THE LAST FLUIDS YOU DRINK BEFORE SURGERY.  PAIN IS EXPECTED AFTER SURGERY AND WILL NOT BE COMPLETELY ELIMINATED. AMBULATION AND TYLENOL WILL HELP REDUCE INCISIONAL AND GAS PAIN. MOVEMENT IS KEY!  YOU ARE EXPECTED TO BE OUT OF BED WITHIN 4 HOURS OF ADMISSION TO YOUR PATIENT ROOM.  SITTING IN THE RECLINER THROUGHOUT THE DAY IS IMPORTANT FOR DRINKING FLUIDS AND MOVING GAS THROUGHOUT THE GI TRACT.  COMPRESS[facility] ST[facility] UN[facility] YOU ARE WALKING.   INCENTIVE SPIROMETER SHOULD BE USED EVERY HOUR WHILE AWAKE TO DECREASE POST-OPERATIVE COMPLICATIONS SUCH AS PNEUMONIA.  WHEN DISCHARGED HOME, IT IS IMPORTANT TO CONTINUE TO WALK EVERY HOUR AND USE THE INCENTIVE SPIROMETER EVERY HOUR.      CLEAR LIQUID DIET   Foods Allowed                                                                           Foods Excluded  Coffee and tea, regular and decaf   No creamer                          liquids that you cannot  Plain Jell-O any favor except red or purple                                           see through such as: Fruit ices (not with fruit pulp)                                                   milk, soups, orange juice  Iced Popsicles  All solid food                                Cranberry, grape and apple juices Sports drinks like Gatorade Lightly seasoned clear broth or consume(fat free) Sugar, honey syrup  Sample Menu Breakfast                                Lunch                                     Supper Cranberry juice                    Beef broth                            Chicken broth Jell-O                                     Grape juice                           Apple juice Coffee or tea                        Jell-O                                      Popsicle                                                Coffee or tea                        Coffee or tea  _____________________________________________________________________          BRUSH YOUR TEETH MORNING OF SURGERY AND RINSE YOUR MOUTH OUT, NO CHEWING GUM CANDY OR MINTS.     Take these medicines the morning of surgery with A SIP OF WATER: NONE                                 You may not have any metal on your body including hair pins and              piercings  Do not wear jewelry, make-up, lotions, powders or perfumes, deodorant             Do not wear nail polish on your fingernails.  Do not shave  48 hours prior to surgery.       Do not bring valuables to the hospital. [place].  Contacts, dentures or bridgework may not be worn into surgery.  Leave suitcase in the car. After surgery it may be brought to your room.     Patients discharged the day of  surgery will not be allowed to  drive home. IF YOU ARE HAVING SURGERY AND GOING HOME THE SAME DAY, YOU MUST HAVE AN ADULT TO DRIVE YOU HOME AND BE WITH YOU FOR 24 HOURS. YOU MAY GO HOME BY TAXI OR UBER OR ORTHERWISE, BUT AN ADULT MUST ACCOMPANY YOU HOME AND STAY WITH YOU FOR 24 HOURS.  Name and phone number of your driver:  Special Instructions: N/A              Please read over the following fact sheets you were given: _____________________________________________________________________             [facility] - Preparing for Surgery Before surgery, you can play an important role.  Because skin is not sterile, your skin needs to be as free of germs as possible.  You can reduce the number of germs on your skin by washing with CHG (chlorahexidine gluconate) soap before surgery.  CHG is an antiseptic cleaner which kills germs and bonds with the skin to continue killing germs even after washing. Please DO NOT use if you have an allergy to CHG or antibacterial soaps.  If your skin becomes reddened/irritated stop using the CHG and inform your nurse when you arrive at Short Stay. Do not shave (including legs and underarms) for at least 48 hours prior to the first CHG shower.  You may shave your face/neck. Please follow these instructions carefully:  1.  Shower with CHG Soap the night before surgery and the  morning of Surgery.  2.  If you choose to wash your hair, wash your hair first as usual with your  normal  shampoo.  3.  After you shampoo, rinse your hair and body thoroughly to remove the  shampoo.                           4.  Use CHG as you would any other liquid soap.  You can apply chg directly  to the skin and wash                       Gently with a scrungie or clean washcloth.  5.  Apply the CHG Soap to your body ONLY FROM THE NECK DOWN.   Do not use on face/ open                           Wound or open sores. Avoid contact with eyes, ears mouth and genitals (private parts).                       Wash face,  Genitals  (private parts) with your normal soap.             6.  Wash thoroughly, paying special attention to the area where your surgery  will be performed.  7.  Thoroughly rinse your body with warm water from the neck down.  8.  DO NOT shower/wash with your normal soap after using and rinsing off  the CHG Soap.                9.  Pat yourself dry with a clean towel.            10.  Wear clean pajamas.            11.  Place clean sheets on your bed the night of your first shower  and do not  sleep with pets. Day of Surgery : Do not apply any lotions/deodorants the morning of surgery.  Please wear clean clothes to the hospital/surgery center.  FAILURE TO FOLLOW THESE INSTRUCTIONS MAY RESULT IN THE CANCELLATION OF YOUR SURGERY PATIENT SIGNATURE_________________________________  NURSE SIGNATURE__________________________________  ________________________________________________________________________   Rogelia Mire  An incentive spirometer is a tool that can help keep your lungs clear and active. This tool measures how well you are filling your lungs with each breath. Taking long deep breaths may help reverse or decrease the chance of developing breathing (pulmonary) problems (especially infection) following:  A long period of time when you are unable to move or be active. BEFORE THE PROCEDURE   If the spirometer includes an indicator to show your best effort, your nurse or respiratory therapist will set it to a desired goal.  If possible, sit up straight or lean slightly forward. Try not to slouch.  Hold the incentive spirometer in an upright position. INSTRUCTIONS FOR USE  1. Sit on the edge of your bed if possible, or sit up as far as you can in bed or on a chair. 2. Hold the incentive spirometer in an upright position. 3. Breathe out normally. 4. Place the mouthpiece in your mouth and seal your lips tightly around it. 5. Breathe in slowly and as deeply as possible, raising the  piston or the ball toward the top of the column. 6. Hold your breath for 3-5 seconds or for as long as possible. Allow the piston or ball to fall to the bottom of the column. 7. Remove the mouthpiece from your mouth and breathe out normally. 8. Rest for a few seconds and repeat Steps 1 through 7 at least 10 times every 1-2 hours when you are awake. Take your time and take a few normal breaths between deep breaths. 9. The spirometer may include an indicator to show your best effort. Use the indicator as a goal to work toward during each repetition. 10. After each set of 10 deep breaths, practice coughing to be sure your lungs are clear. If you have an incision (the cut made at the time of surgery), support your incision when coughing by placing a pillow or rolled up towels firmly against it. Once you are able to get out of bed, walk around indoors and cough well. You may stop using the incentive spirometer when instructed by your caregiver.  RISKS AND COMPLICATIONS  Take your time so you do not get dizzy or light-headed.  If you are in pain, you may need to take or ask for pain medication before doing incentive spirometry. It is harder to take a deep breath if you are having pain. AFTER USE  Rest and breathe slowly and easily.  It can be helpful to keep track of a log of your progress. Your caregiver can provide you with a simple table to help with this. If you are using the spirometer at home, follow these instructions: SEEK MEDICAL CARE IF:   You are having difficultly using the spirometer.  You have trouble using the spirometer as often as instructed.  Your pain medication is not giving enough relief while using the spirometer.  You develop fever of 100.5 F (38.1 C) or higher. SEEK IMMEDIATE MEDICAL CARE IF:   You cough up bloody sputum that had not been present before.  You develop fever of 102 F (38.9 C) or greater.  You develop worsening pain at or near the incision  site. MAKE  SURE YOU:   Understand these instructions.  Will watch your condition.  Will get help right away if you are not doing well or get worse. Document Released: 11/12/2006 Document Revised: 09/24/2011 Document Reviewed: 01/13/2007 Exit[employer] Patient Information 2014 Exit[employer], [place].   ________________________________________________________________________  WHAT IS A BLOOD TRANSFUSION? Blood Transfusion Information  A transfusion is the replacement of blood or some of its parts. Blood is made up of multiple cells which provide different functions.  Red blood cells carry oxygen and are used for blood loss replacement.  White blood cells fight against infection.  Platelets control bleeding.  Plasma helps clot blood.  Other blood products are available for specialized needs, such as hemophilia or other clotting disorders. BEFORE THE TRANSFUSION  Who gives blood for transfusions?   Healthy volunteers who are fully evaluated to make sure their blood is safe. This is blood bank blood. Transfusion therapy is the safest it has ever been in the practice of medicine. Before blood is taken from a donor, a complete history is taken to make sure that person has no history of diseases nor engages in risky social behavior (examples are intravenous drug use or sexual activity with multiple partners). The donor's travel history is screened to minimize risk of transmitting infections, such as malaria. The donated blood is tested for signs of infectious diseases, such as HIV and hepatitis. The blood is then tested to be sure it is compatible with you in order to minimize the chance of a transfusion reaction. If you or a relative donates blood, this is often done in anticipation of surgery and is not appropriate for emergency situations. It takes many days to process the donated blood. RISKS AND COMPLICATIONS Although transfusion therapy is very safe and saves many lives, the main dangers of  transfusion include:   Getting an infectious disease.  Developing a transfusion reaction. This is an allergic reaction to something in the blood you were given. Every precaution is taken to prevent this. The decision to have a blood transfusion has been considered carefully by your caregiver before blood is given. Blood is not given unless the benefits outweigh the risks. AFTER THE TRANSFUSION  Right after receiving a blood transfusion, you will usually feel much better and more energetic. This is especially true if your red blood cells have gotten low (anemic). The transfusion raises the level of the red blood cells which carry oxygen, and this usually causes an energy increase.  The nurse administering the transfusion will monitor you carefully for complications. HOME CARE INSTRUCTIONS  No special instructions are needed after a transfusion. You may find your energy is better. Speak with your caregiver about any limitations on activity for underlying diseases you may have. SEEK MEDICAL CARE IF:   Your condition is not improving after your transfusion.  You develop redness or irritation at the intravenous (IV) site. SEEK IMMEDIATE MEDICAL CARE IF:  Any of the following symptoms occur over the next 12 hours:  Shaking chills.  You have a temperature by mouth above 102 F (38.9 C), not controlled by medicine.  Chest, back, or muscle pain.  People around you feel you are not acting correctly or are confused.  Shortness of breath or difficulty breathing.  Dizziness and fainting.  You get a rash or develop hives.  You have a decrease in urine output.  Your urine turns a dark color or changes to pink, red, or brown. Any of the following symptoms occur over the next 10 days:  You  have a temperature by mouth above 102 F (38.9 C), not controlled by medicine.  Shortness of breath.  Weakness after normal activity.  The white part of the eye turns yellow (jaundice).  You have a  decrease in the amount of urine or are urinating less often.  Your urine turns a dark color or changes to pink, red, or brown. Document Released: 06/29/2000 Document Revised: 09/24/2011 Document Reviewed: 02/16/2008 [facility] Patient Information 2014 [place], [place].  _______________________________________________________________________

## 2019-11-04 ENCOUNTER — Other Ambulatory Visit: Payer: Self-pay

## 2019-11-04 ENCOUNTER — Encounter (HOSPITAL_COMMUNITY)
Admission: RE | Admit: 2019-11-04 | Discharge: 2019-11-04 | Disposition: A | Discharge status: Home / Self Care | Payer: 59 | Source: Ambulatory Visit | Attending: General Surgery | Admitting: General Surgery

## 2019-11-04 ENCOUNTER — Encounter (HOSPITAL_COMMUNITY): Payer: Self-pay

## 2019-11-04 DIAGNOSIS — Z01818 Encounter for other preprocedural examination: Secondary | ICD-10-CM | POA: Insufficient documentation

## 2019-11-04 DIAGNOSIS — Z20822 Contact with and (suspected) exposure to covid-19: Secondary | ICD-10-CM | POA: Diagnosis not present

## 2019-11-04 HISTORY — DX: Sleep apnea, unspecified: G47.30

## 2019-11-04 HISTORY — DX: Personal history of urinary calculi: Z87.442

## 2019-11-04 HISTORY — DX: Gastro-esophageal reflux disease without esophagitis: K21.9

## 2019-11-05 ENCOUNTER — Encounter (HOSPITAL_COMMUNITY)
Admission: RE | Admit: 2019-11-05 | Discharge: 2019-11-05 | Disposition: A | Discharge status: Home / Self Care | Payer: 59 | Source: Ambulatory Visit | Attending: General Surgery | Admitting: General Surgery

## 2019-11-05 ENCOUNTER — Other Ambulatory Visit (HOSPITAL_COMMUNITY)
Admission: RE | Admit: 2019-11-05 | Discharge: 2019-11-05 | Disposition: A | Discharge status: Home / Self Care | Payer: 59 | Source: Ambulatory Visit | Attending: General Surgery | Admitting: General Surgery

## 2019-11-05 DIAGNOSIS — Z01818 Encounter for other preprocedural examination: Secondary | ICD-10-CM | POA: Diagnosis not present

## 2019-11-05 LAB — SARS CORONAVIRUS 2 (TAT 6-24 HRS): SARS Coronavirus 2: NEGATIVE

## 2019-11-05 LAB — COMPREHENSIVE METABOLIC PANEL WITH GFR
ALT: 22 U/L (ref 0–44)
AST: 16 U/L (ref 15–41)
Albumin: 4 g/dL (ref 3.5–5.0)
Alkaline Phosphatase: 68 U/L (ref 38–126)
Anion gap: 7 (ref 5–15)
BUN: 20 mg/dL (ref 6–20)
CO2: 27 mmol/L (ref 22–32)
Calcium: 9 mg/dL (ref 8.9–10.3)
Chloride: 103 mmol/L (ref 98–111)
Creatinine, Ser: 0.68 mg/dL (ref 0.44–1.00)
GFR calc Af Amer: >60 mL/min
GFR calc non Af Amer: >60 mL/min
Glucose, Bld: 99 mg/dL (ref 70–99)
Potassium: 4.1 mmol/L (ref 3.5–5.1)
Sodium: 137 mmol/L (ref 135–145)
Total Bilirubin: 0.6 mg/dL (ref 0.3–1.2)
Total Protein: 7 g/dL (ref 6.5–8.1)

## 2019-11-05 LAB — CBC WITH DIFFERENTIAL/PLATELET
Abs Immature Granulocytes: 0.04 K/uL (ref 0.00–0.07)
Basophils Absolute: 0.1 K/uL (ref 0.0–0.1)
Basophils Relative: 1 %
Eosinophils Absolute: 0.2 K/uL (ref 0.0–0.5)
Eosinophils Relative: 2 %
HCT: 41.9 % (ref 36.0–46.0)
Hemoglobin: 14.1 g/dL (ref 12.0–15.0)
Immature Granulocytes: 1 %
Lymphocytes Relative: 31 %
Lymphs Abs: 2.5 K/uL (ref 0.7–4.0)
MCH: 32.1 pg (ref 26.0–34.0)
MCHC: 33.7 g/dL (ref 30.0–36.0)
MCV: 95.4 fL (ref 80.0–100.0)
Monocytes Absolute: 0.5 K/uL (ref 0.1–1.0)
Monocytes Relative: 6 %
Neutro Abs: 4.8 K/uL (ref 1.7–7.7)
Neutrophils Relative %: 59 %
Platelets: 248 K/uL (ref 150–400)
RBC: 4.39 MIL/uL (ref 3.87–5.11)
RDW: 12.7 % (ref 11.5–15.5)
WBC: 8.1 K/uL (ref 4.0–10.5)
nRBC: 0 % (ref 0.0–0.2)

## 2019-11-05 LAB — ABO/RH: ABO/RH(D): A POS

## 2019-11-08 MED ORDER — BUPIVACAINE LIPOSOME 1.3 % IJ SUSP
20.0000 mL | Freq: Once | INTRAMUSCULAR | Status: DC
  Filled 2019-11-08: qty 20

## 2019-11-09 ENCOUNTER — Inpatient Hospital Stay (HOSPITAL_COMMUNITY): Payer: No Typology Code available for payment source | Admitting: Registered Nurse

## 2019-11-09 ENCOUNTER — Encounter (HOSPITAL_COMMUNITY)
Admission: RE | Disposition: A | Discharge status: Home / Self Care | Payer: Self-pay | Source: Home / Self Care | Attending: General Surgery

## 2019-11-09 ENCOUNTER — Inpatient Hospital Stay (HOSPITAL_COMMUNITY): Payer: No Typology Code available for payment source | Admitting: Physician Assistant

## 2019-11-09 ENCOUNTER — Inpatient Hospital Stay (HOSPITAL_COMMUNITY)
Admission: RE | Admit: 2019-11-09 | Discharge: 2019-11-10 | DRG: 621 | Disposition: A | Discharge status: Home / Self Care | Payer: No Typology Code available for payment source | Attending: General Surgery | Admitting: General Surgery

## 2019-11-09 ENCOUNTER — Encounter (HOSPITAL_COMMUNITY): Payer: Self-pay | Admitting: General Surgery

## 2019-11-09 ENCOUNTER — Other Ambulatory Visit: Payer: Self-pay

## 2019-11-09 DIAGNOSIS — Z832 Family history of diseases of the blood and blood-forming organs and certain disorders involving the immune mechanism: Secondary | ICD-10-CM

## 2019-11-09 DIAGNOSIS — Z83438 Family history of other disorder of lipoprotein metabolism and other lipidemia: Secondary | ICD-10-CM | POA: Diagnosis not present

## 2019-11-09 DIAGNOSIS — K760 Fatty (change of) liver, not elsewhere classified: Secondary | ICD-10-CM | POA: Diagnosis present

## 2019-11-09 DIAGNOSIS — Z9089 Acquired absence of other organs: Secondary | ICD-10-CM | POA: Diagnosis not present

## 2019-11-09 DIAGNOSIS — Z830 Family history of human immunodeficiency virus [HIV] disease: Secondary | ICD-10-CM | POA: Diagnosis not present

## 2019-11-09 DIAGNOSIS — Z87442 Personal history of urinary calculi: Secondary | ICD-10-CM | POA: Diagnosis not present

## 2019-11-09 DIAGNOSIS — Z9884 Bariatric surgery status: Secondary | ICD-10-CM

## 2019-11-09 DIAGNOSIS — Z885 Allergy status to narcotic agent status: Secondary | ICD-10-CM | POA: Diagnosis not present

## 2019-11-09 DIAGNOSIS — G4733 Obstructive sleep apnea (adult) (pediatric): Secondary | ICD-10-CM | POA: Diagnosis present

## 2019-11-09 DIAGNOSIS — M25562 Pain in left knee: Secondary | ICD-10-CM | POA: Diagnosis present

## 2019-11-09 DIAGNOSIS — Z801 Family history of malignant neoplasm of trachea, bronchus and lung: Secondary | ICD-10-CM

## 2019-11-09 DIAGNOSIS — Z888 Allergy status to other drugs, medicaments and biological substances status: Secondary | ICD-10-CM

## 2019-11-09 DIAGNOSIS — G43909 Migraine, unspecified, not intractable, without status migrainosus: Secondary | ICD-10-CM | POA: Diagnosis present

## 2019-11-09 DIAGNOSIS — G8929 Other chronic pain: Secondary | ICD-10-CM | POA: Diagnosis present

## 2019-11-09 DIAGNOSIS — Z79899 Other long term (current) drug therapy: Secondary | ICD-10-CM

## 2019-11-09 DIAGNOSIS — Z91041 Radiographic dye allergy status: Secondary | ICD-10-CM

## 2019-11-09 DIAGNOSIS — K219 Gastro-esophageal reflux disease without esophagitis: Secondary | ICD-10-CM | POA: Diagnosis present

## 2019-11-09 DIAGNOSIS — E785 Hyperlipidemia, unspecified: Secondary | ICD-10-CM | POA: Diagnosis present

## 2019-11-09 DIAGNOSIS — E7800 Pure hypercholesterolemia, unspecified: Secondary | ICD-10-CM | POA: Diagnosis present

## 2019-11-09 DIAGNOSIS — Z6841 Body Mass Index (BMI) 40.0 and over, adult: Secondary | ICD-10-CM | POA: Diagnosis not present

## 2019-11-09 DIAGNOSIS — E781 Pure hyperglyceridemia: Secondary | ICD-10-CM | POA: Diagnosis present

## 2019-11-09 DIAGNOSIS — Z8669 Personal history of other diseases of the nervous system and sense organs: Secondary | ICD-10-CM

## 2019-11-09 HISTORY — PX: GASTRIC ROUX-EN-Y: SHX5262

## 2019-11-09 LAB — HEMOGLOBIN AND HEMATOCRIT, BLOOD
HCT: 42 % (ref 36.0–46.0)
Hemoglobin: 13.9 g/dL (ref 12.0–15.0)

## 2019-11-09 LAB — TYPE AND SCREEN
ABO/RH(D): A POS
Antibody Screen: NEGATIVE

## 2019-11-09 LAB — PREGNANCY, URINE: Preg Test, Ur: NEGATIVE

## 2019-11-09 SURGERY — LAPAROSCOPIC ROUX-EN-Y GASTRIC BYPASS WITH UPPER ENDOSCOPY
Anesthesia: General | Site: Abdomen

## 2019-11-09 MED ORDER — LIDOCAINE 20MG/ML (2%) 15 ML SYRINGE OPTIME
INTRAMUSCULAR | Status: DC | PRN
  Administered 2019-11-09: 1.5 mg/kg/h via INTRAVENOUS

## 2019-11-09 MED ORDER — PANTOPRAZOLE SODIUM 40 MG IV SOLR
40.0000 mg | Freq: Every day | INTRAVENOUS | Status: DC
  Administered 2019-11-09: 40 mg via INTRAVENOUS
  Filled 2019-11-09: qty 40

## 2019-11-09 MED ORDER — LACTATED RINGERS IR SOLN
Status: DC | PRN
  Administered 2019-11-09: 1000 mL

## 2019-11-09 MED ORDER — KCL IN DEXTROSE-NACL 20-5-0.45 MEQ/L-%-% IV SOLN
INTRAVENOUS | Status: DC
  Filled 2019-11-09 (×3): qty 1000

## 2019-11-09 MED ORDER — LACTATED RINGERS IV SOLN
INTRAVENOUS | Status: DC | PRN
Start: 2019-11-09 — End: 2019-11-09

## 2019-11-09 MED ORDER — SUGAMMADEX SODIUM 200 MG/2ML IV SOLN
INTRAVENOUS | Status: DC | PRN
  Administered 2019-11-09: 500 mg via INTRAVENOUS

## 2019-11-09 MED ORDER — ROCURONIUM BROMIDE 10 MG/ML (PF) SYRINGE
PREFILLED_SYRINGE | INTRAVENOUS | Status: DC | PRN
  Administered 2019-11-09 (×2): 20 mg via INTRAVENOUS
  Administered 2019-11-09: 60 mg via INTRAVENOUS

## 2019-11-09 MED ORDER — GABAPENTIN 300 MG PO CAPS
300.0000 mg | ORAL_CAPSULE | ORAL | Status: AC
  Administered 2019-11-09: 300 mg via ORAL
  Filled 2019-11-09: qty 1

## 2019-11-09 MED ORDER — DEXAMETHASONE SODIUM PHOSPHATE 4 MG/ML IJ SOLN: 4.0000 mg | INTRAMUSCULAR | Status: DC

## 2019-11-09 MED ORDER — "VISTASEAL 4 ML SINGLE DOSE KIT "
4.0000 mL | PACK | Freq: Once | CUTANEOUS | Status: AC
  Administered 2019-11-09: 4 mL via TOPICAL
  Filled 2019-11-09: qty 4

## 2019-11-09 MED ORDER — LIDOCAINE 2% (20 MG/ML) 5 ML SYRINGE
INTRAMUSCULAR | Status: DC | PRN
  Administered 2019-11-09: 60 mg via INTRAVENOUS

## 2019-11-09 MED ORDER — ENOXAPARIN SODIUM 30 MG/0.3ML ~~LOC~~ SOLN
30.0000 mg | Freq: Two times a day (BID) | SUBCUTANEOUS | Status: DC
  Administered 2019-11-09: 30 mg via SUBCUTANEOUS
  Filled 2019-11-09: qty 0.3

## 2019-11-09 MED ORDER — APREPITANT 40 MG PO CAPS
40.0000 mg | ORAL_CAPSULE | ORAL | Status: AC
  Administered 2019-11-09: 40 mg via ORAL
  Filled 2019-11-09: qty 1

## 2019-11-09 MED ORDER — PHENYLEPHRINE 40 MCG/ML (10ML) SYRINGE FOR IV PUSH (FOR BLOOD PRESSURE SUPPORT)
PREFILLED_SYRINGE | INTRAVENOUS | Status: AC
  Filled 2019-11-09: qty 10

## 2019-11-09 MED ORDER — FENTANYL CITRATE (PF) 250 MCG/5ML IJ SOLN
INTRAMUSCULAR | Status: DC | PRN
  Administered 2019-11-09 (×2): 50 ug via INTRAVENOUS
  Administered 2019-11-09: 150 ug via INTRAVENOUS

## 2019-11-09 MED ORDER — PROPOFOL 10 MG/ML IV BOLUS
INTRAVENOUS | Status: DC | PRN
  Administered 2019-11-09: 150 mg via INTRAVENOUS

## 2019-11-09 MED ORDER — HYDROMORPHONE HCL 1 MG/ML IJ SOLN
INTRAMUSCULAR | Status: AC
  Filled 2019-11-09: qty 1

## 2019-11-09 MED ORDER — FENTANYL CITRATE (PF) 250 MCG/5ML IJ SOLN
INTRAMUSCULAR | Status: AC
  Filled 2019-11-09: qty 5

## 2019-11-09 MED ORDER — STERILE WATER FOR IRRIGATION IR SOLN
Status: DC | PRN
  Administered 2019-11-09: 1000 mL

## 2019-11-09 MED ORDER — CHLORHEXIDINE GLUCONATE 4 % EX LIQD: 60.0000 mL | Freq: Once | CUTANEOUS | Status: DC

## 2019-11-09 MED ORDER — ALBUMIN HUMAN 5 % IV SOLN
INTRAVENOUS | Status: AC
  Filled 2019-11-09: qty 250

## 2019-11-09 MED ORDER — EPHEDRINE SULFATE-NACL 50-0.9 MG/10ML-% IV SOSY
PREFILLED_SYRINGE | INTRAVENOUS | Status: DC | PRN
  Administered 2019-11-09 (×2): 15 mg via INTRAVENOUS
  Administered 2019-11-09 (×2): 10 mg via INTRAVENOUS

## 2019-11-09 MED ORDER — ROCURONIUM BROMIDE 10 MG/ML (PF) SYRINGE
PREFILLED_SYRINGE | INTRAVENOUS | Status: AC
  Filled 2019-11-09: qty 10

## 2019-11-09 MED ORDER — DEXAMETHASONE SODIUM PHOSPHATE 10 MG/ML IJ SOLN
INTRAMUSCULAR | Status: DC | PRN
  Administered 2019-11-09: 10 mg via INTRAVENOUS

## 2019-11-09 MED ORDER — KETAMINE HCL 10 MG/ML IJ SOLN
INTRAMUSCULAR | Status: DC | PRN
  Administered 2019-11-09: 30 mg via INTRAVENOUS

## 2019-11-09 MED ORDER — HYDRALAZINE HCL 20 MG/ML IJ SOLN: 10.0000 mg | INTRAMUSCULAR | Status: DC | PRN

## 2019-11-09 MED ORDER — OXYCODONE HCL 5 MG/5ML PO SOLN: 5.0000 mg | Freq: Four times a day (QID) | ORAL | Status: DC | PRN

## 2019-11-09 MED ORDER — KETAMINE HCL 10 MG/ML IJ SOLN
INTRAMUSCULAR | Status: AC
  Filled 2019-11-09: qty 1

## 2019-11-09 MED ORDER — OXYCODONE HCL 5 MG PO TABS: 5.0000 mg | ORAL_TABLET | Freq: Once | ORAL | Status: DC | PRN

## 2019-11-09 MED ORDER — GABAPENTIN 100 MG PO CAPS
200.0000 mg | ORAL_CAPSULE | Freq: Two times a day (BID) | ORAL | Status: DC
  Administered 2019-11-09: 200 mg via ORAL
  Filled 2019-11-09: qty 2

## 2019-11-09 MED ORDER — SODIUM CHLORIDE (PF) 0.9 % IJ SOLN
INTRAMUSCULAR | Status: DC | PRN
  Administered 2019-11-09: 50 mL

## 2019-11-09 MED ORDER — PHENYLEPHRINE 40 MCG/ML (10ML) SYRINGE FOR IV PUSH (FOR BLOOD PRESSURE SUPPORT)
PREFILLED_SYRINGE | INTRAVENOUS | Status: DC | PRN
  Administered 2019-11-09: 120 ug via INTRAVENOUS
  Administered 2019-11-09: 80 ug via INTRAVENOUS
  Administered 2019-11-09: 120 ug via INTRAVENOUS

## 2019-11-09 MED ORDER — ACETAMINOPHEN 500 MG PO TABS
1000.0000 mg | ORAL_TABLET | ORAL | Status: AC
  Administered 2019-11-09: 1000 mg via ORAL
  Filled 2019-11-09: qty 2

## 2019-11-09 MED ORDER — ONDANSETRON HCL 4 MG/2ML IJ SOLN
INTRAMUSCULAR | Status: DC | PRN
  Administered 2019-11-09: 4 mg via INTRAVENOUS

## 2019-11-09 MED ORDER — DEXAMETHASONE SODIUM PHOSPHATE 10 MG/ML IJ SOLN
INTRAMUSCULAR | Status: AC
  Filled 2019-11-09: qty 1

## 2019-11-09 MED ORDER — SCOPOLAMINE 1 MG/3DAYS TD PT72
1.0000 | MEDICATED_PATCH | TRANSDERMAL | Status: DC
  Administered 2019-11-09: 1.5 mg via TRANSDERMAL
  Filled 2019-11-09: qty 1

## 2019-11-09 MED ORDER — EPHEDRINE 5 MG/ML INJ
INTRAVENOUS | Status: AC
  Filled 2019-11-09: qty 10

## 2019-11-09 MED ORDER — SUGAMMADEX SODIUM 500 MG/5ML IV SOLN
INTRAVENOUS | Status: AC
  Filled 2019-11-09: qty 5

## 2019-11-09 MED ORDER — MIDAZOLAM HCL 2 MG/2ML IJ SOLN
INTRAMUSCULAR | Status: AC
  Filled 2019-11-09: qty 2

## 2019-11-09 MED ORDER — DIPHENHYDRAMINE HCL 50 MG/ML IJ SOLN: 12.5000 mg | Freq: Three times a day (TID) | INTRAMUSCULAR | Status: DC | PRN

## 2019-11-09 MED ORDER — ONDANSETRON HCL 4 MG/2ML IJ SOLN
INTRAMUSCULAR | Status: AC
  Filled 2019-11-09: qty 2

## 2019-11-09 MED ORDER — ENSURE MAX PROTEIN PO LIQD
2.0000 [oz_av] | ORAL | Status: DC
  Administered 2019-11-10 (×2): 2 [oz_av] via ORAL

## 2019-11-09 MED ORDER — LACTATED RINGERS IV SOLN: INTRAVENOUS | Status: DC

## 2019-11-09 MED ORDER — SODIUM CHLORIDE (PF) 0.9 % IJ SOLN
INTRAMUSCULAR | Status: AC
  Filled 2019-11-09: qty 50

## 2019-11-09 MED ORDER — ACETAMINOPHEN 500 MG PO TABS
1000.0000 mg | ORAL_TABLET | Freq: Three times a day (TID) | ORAL | Status: DC
  Administered 2019-11-10: 1000 mg via ORAL
  Filled 2019-11-09: qty 2

## 2019-11-09 MED ORDER — BUPIVACAINE LIPOSOME 1.3 % IJ SUSP
INTRAMUSCULAR | Status: DC | PRN
  Administered 2019-11-09: 20 mL

## 2019-11-09 MED ORDER — SODIUM CHLORIDE 0.9 % IV SOLN
2.0000 g | INTRAVENOUS | Status: AC
  Administered 2019-11-09: 2 g via INTRAVENOUS
  Filled 2019-11-09: qty 2

## 2019-11-09 MED ORDER — ACETAMINOPHEN 160 MG/5ML PO SOLN
1000.0000 mg | Freq: Three times a day (TID) | ORAL | Status: DC
  Administered 2019-11-09: 1000 mg via ORAL
  Filled 2019-11-09: qty 40.6

## 2019-11-09 MED ORDER — ALBUMIN HUMAN 5 % IV SOLN: INTRAVENOUS | Status: DC | PRN

## 2019-11-09 MED ORDER — OXYCODONE HCL 5 MG/5ML PO SOLN: 5.0000 mg | Freq: Once | ORAL | Status: DC | PRN

## 2019-11-09 MED ORDER — HEPARIN SODIUM (PORCINE) 5000 UNIT/ML IJ SOLN
5000.0000 [IU] | INTRAMUSCULAR | Status: AC
  Administered 2019-11-09: 5000 [IU] via SUBCUTANEOUS
  Filled 2019-11-09: qty 1

## 2019-11-09 MED ORDER — 0.9 % SODIUM CHLORIDE (POUR BTL) OPTIME
TOPICAL | Status: DC | PRN
  Administered 2019-11-09: 1000 mL

## 2019-11-09 MED ORDER — MIDAZOLAM HCL 5 MG/5ML IJ SOLN
INTRAMUSCULAR | Status: DC | PRN
  Administered 2019-11-09: 2 mg via INTRAVENOUS

## 2019-11-09 MED ORDER — LIDOCAINE HCL 2 % IJ SOLN
INTRAMUSCULAR | Status: AC
  Filled 2019-11-09: qty 20

## 2019-11-09 MED ORDER — LIDOCAINE 2% (20 MG/ML) 5 ML SYRINGE
INTRAMUSCULAR | Status: AC
  Filled 2019-11-09: qty 5

## 2019-11-09 MED ORDER — HYDROMORPHONE HCL 1 MG/ML IJ SOLN
0.2500 mg | INTRAMUSCULAR | Status: DC | PRN
  Administered 2019-11-09 (×2): 0.5 mg via INTRAVENOUS

## 2019-11-09 MED ORDER — FENTANYL CITRATE (PF) 100 MCG/2ML IJ SOLN: 25.0000 ug | INTRAMUSCULAR | Status: DC | PRN

## 2019-11-09 MED ORDER — SIMETHICONE 80 MG PO CHEW: 80.0000 mg | CHEWABLE_TABLET | Freq: Four times a day (QID) | ORAL | Status: DC | PRN

## 2019-11-09 MED ORDER — PROMETHAZINE HCL 25 MG/ML IJ SOLN: 6.2500 mg | INTRAMUSCULAR | Status: DC | PRN

## 2019-11-09 MED ORDER — ONDANSETRON HCL 4 MG/2ML IJ SOLN: 4.0000 mg | Freq: Four times a day (QID) | INTRAMUSCULAR | Status: DC | PRN

## 2019-11-09 MED ORDER — PROMETHAZINE HCL 25 MG/ML IJ SOLN: 12.5000 mg | Freq: Four times a day (QID) | INTRAMUSCULAR | Status: DC | PRN

## 2019-11-09 SURGICAL SUPPLY — 110 items
APL LAPSCP 35 DL APL RGD (MISCELLANEOUS) ×4
APL PRP STRL LF DISP 70% ISPRP (MISCELLANEOUS) ×8
APL SKNCLS STERI-STRIP NONHPOA (GAUZE/BANDAGES/DRESSINGS) ×2
APL SRG 32X5 SNPLK LF DISP (MISCELLANEOUS)
APL SWBSTK 6 STRL LF DISP (MISCELLANEOUS)
APPLICATOR COTTON TIP 6 STRL (MISCELLANEOUS) IMPLANT
APPLICATOR COTTON TIP 6IN STRL (MISCELLANEOUS)
APPLICATOR VISTASEAL 35 (MISCELLANEOUS) ×8 IMPLANT
APPLIER CLIP ROT 10 11.4 M/L (STAPLE) ×4
APPLIER CLIP ROT 13.4 12 LRG (CLIP)
APR CLP LRG 13.4X12 ROT 20 MLT (CLIP)
APR CLP MED LRG 11.4X10 (STAPLE) ×2
BENZOIN TINCTURE PRP APPL 2/3 (GAUZE/BANDAGES/DRESSINGS) ×5 IMPLANT
BLADE SURG SZ11 CARB STEEL (BLADE) ×8 IMPLANT
BNDG ADH 1X3 SHEER STRL LF (GAUZE/BANDAGES/DRESSINGS) ×15 IMPLANT
BNDG ADH THN 3X1 STRL LF (GAUZE/BANDAGES/DRESSINGS) ×2
CABLE HIGH FREQUENCY MONO STRZ (ELECTRODE) ×4 IMPLANT
CHLORAPREP W/TINT 26 (MISCELLANEOUS) ×16 IMPLANT
CLIP APPLIE ROT 10 11.4 M/L (STAPLE) ×1 IMPLANT
CLIP APPLIE ROT 13.4 12 LRG (CLIP) IMPLANT
CLIP SUT LAPRA TY ABSORB (SUTURE) ×8 IMPLANT
CLOSURE WOUND 1/2 X4 (GAUZE/BANDAGES/DRESSINGS) ×1
COVER SURGICAL LIGHT HANDLE (MISCELLANEOUS) ×4 IMPLANT
COVER WAND RF STERILE (DRAPES) IMPLANT
CUTTER FLEX LINEAR 45M (STAPLE) IMPLANT
DECANTER SPIKE VIAL GLASS SM (MISCELLANEOUS) ×4 IMPLANT
DEVICE SUT QUICK LOAD TK 5 (STAPLE) IMPLANT
DEVICE SUT TI-KNOT TK 5X26 (MISCELLANEOUS) IMPLANT
DEVICE SUTURE ENDOST 10MM (ENDOMECHANICALS) ×4 IMPLANT
DEVICE TI KNOT TK5 (MISCELLANEOUS)
DISSECTOR BLUNT TIP ENDO 5MM (MISCELLANEOUS) IMPLANT
DRAIN PENROSE 0.25X18 (DRAIN) ×4 IMPLANT
DRAPE UTILITY XL STRL (DRAPES) ×8 IMPLANT
ELECT L-HOOK LAP 45CM DISP (ELECTROSURGICAL)
ELECT REM PT RETURN 15FT ADLT (MISCELLANEOUS) ×8 IMPLANT
ELECTRODE L-HOOK LAP 45CM DISP (ELECTROSURGICAL) IMPLANT
GAUZE 4X4 16PLY RFD (DISPOSABLE) ×4 IMPLANT
GAUZE SPONGE 2X2 8PLY STRL LF (GAUZE/BANDAGES/DRESSINGS) IMPLANT
GAUZE SPONGE 4X4 12PLY STRL (GAUZE/BANDAGES/DRESSINGS) IMPLANT
GLOVE BIO SURGEON STRL SZ7.5 (GLOVE) ×4 IMPLANT
GLOVE INDICATOR 8.0 STRL GRN (GLOVE) ×8 IMPLANT
GOWN STRL REUS W/TWL XL LVL3 (GOWN DISPOSABLE) ×28 IMPLANT
GRASPER SUT TROCAR 14GX15 (MISCELLANEOUS) ×4 IMPLANT
HOVERMATT SINGLE USE (MISCELLANEOUS) ×8 IMPLANT
KIT BASIN (CUSTOM PROCEDURE TRAY) ×8 IMPLANT
KIT TURNOVER KIT A (KITS) IMPLANT
MARKER SKIN DUAL TIP RULER LAB (MISCELLANEOUS) ×8 IMPLANT
NDL SPNL 22GX3.5 QUINCKE BK (NEEDLE) ×2 IMPLANT
NEEDLE SPNL 22GX3.5 QUINCKE BK (NEEDLE) ×8 IMPLANT
PACK CARDIOVASCULAR III (CUSTOM PROCEDURE TRAY) ×4 IMPLANT
PACK UNIVERSAL I (CUSTOM PROCEDURE TRAY) ×4 IMPLANT
PENCIL SMOKE EVACUATOR (MISCELLANEOUS) IMPLANT
QUICK LOAD TK 5 (STAPLE)
RELOAD 45 VASCULAR/THIN (ENDOMECHANICALS) IMPLANT
RELOAD ENDO STITCH 2.0 (ENDOMECHANICALS) ×36
RELOAD STAPLE 45 2.5 WHT GRN (ENDOMECHANICALS) IMPLANT
RELOAD STAPLE 45 3.5 BLU ETS (ENDOMECHANICALS) IMPLANT
RELOAD STAPLE 60 2.6 WHT THN (STAPLE) ×2 IMPLANT
RELOAD STAPLE 60 3.6 BLU REG (STAPLE) ×3 IMPLANT
RELOAD STAPLE 60 3.8 GOLD REG (STAPLE) ×1 IMPLANT
RELOAD STAPLE 60 4.1 GRN THCK (STAPLE) ×1 IMPLANT
RELOAD STAPLE 60 BLK VRY/THCK (STAPLE) IMPLANT
RELOAD STAPLE TA45 3.5 REG BLU (ENDOMECHANICALS) IMPLANT
RELOAD STAPLER 60MM BLK (STAPLE) IMPLANT
RELOAD STAPLER BLUE 60MM (STAPLE) ×10 IMPLANT
RELOAD STAPLER GOLD 60MM (STAPLE) ×4 IMPLANT
RELOAD STAPLER GREEN 60MM (STAPLE) ×2 IMPLANT
RELOAD STAPLER WHITE 60MM (STAPLE) ×4 IMPLANT
RELOAD SUT SNGL STCH ABSRB 2-0 (ENDOMECHANICALS) ×5 IMPLANT
RELOAD SUT SNGL STCH BLK 2-0 (ENDOMECHANICALS) ×4 IMPLANT
SCISSORS LAP 5X45 EPIX DISP (ENDOMECHANICALS) ×4 IMPLANT
SEALANT SURGICAL APPL DUAL CAN (MISCELLANEOUS) IMPLANT
SET IRRIG TUBING LAPAROSCOPIC (IRRIGATION / IRRIGATOR) ×8 IMPLANT
SET TUBE SMOKE EVAC HIGH FLOW (TUBING) ×8 IMPLANT
SHEARS HARMONIC ACE PLUS 45CM (MISCELLANEOUS) ×8 IMPLANT
SLEEVE XCEL OPT CAN 5 100 (ENDOMECHANICALS) ×16 IMPLANT
SOL ANTI FOG 6CC (MISCELLANEOUS) ×4 IMPLANT
SOLUTION ANTI FOG 6CC (MISCELLANEOUS) ×4
SPONGE GAUZE 2X2 STER 10/PKG (GAUZE/BANDAGES/DRESSINGS)
SPONGE LAP 18X18 RF (DISPOSABLE) ×4 IMPLANT
STAPLER ECHELON BIOABSB 60 FLE (MISCELLANEOUS) ×4 IMPLANT
STAPLER ECHELON LONG 60 440 (INSTRUMENTS) ×8 IMPLANT
STAPLER RELOAD 60MM BLK (STAPLE)
STAPLER RELOAD BLUE 60MM (STAPLE) ×20
STAPLER RELOAD GOLD 60MM (STAPLE) ×8
STAPLER RELOAD GREEN 60MM (STAPLE) ×4
STAPLER RELOAD WHITE 60MM (STAPLE) ×8
STRIP CLOSURE SKIN 1/2X4 (GAUZE/BANDAGES/DRESSINGS) ×4 IMPLANT
SURGILUBE 2OZ TUBE FLIPTOP (MISCELLANEOUS) ×4 IMPLANT
SUT MNCRL AB 4-0 PS2 18 (SUTURE) ×8 IMPLANT
SUT RELOAD ENDO STITCH 2 48X1 (ENDOMECHANICALS) ×10
SUT RELOAD ENDO STITCH 2.0 (ENDOMECHANICALS) ×8
SUT SURGIDAC NAB ES-9 0 48 120 (SUTURE) IMPLANT
SUT VIC AB 2-0 SH 27 (SUTURE) ×4
SUT VIC AB 2-0 SH 27X BRD (SUTURE) ×2 IMPLANT
SUT VICRYL 0 TIES 12 18 (SUTURE) ×4 IMPLANT
SUTURE RELOAD END STTCH 2 48X1 (ENDOMECHANICALS) ×10 IMPLANT
SUTURE RELOAD ENDO STITCH 2.0 (ENDOMECHANICALS) ×8 IMPLANT
SYR 20ML LL LF (SYRINGE) ×12 IMPLANT
SYR 50ML LL SCALE MARK (SYRINGE) ×4 IMPLANT
TOWEL OR 17X26 10 PK STRL BLUE (TOWEL DISPOSABLE) ×8 IMPLANT
TOWEL OR NON WOVEN STRL DISP B (DISPOSABLE) ×8 IMPLANT
TRAY FOLEY MTR SLVR 16FR STAT (SET/KITS/TRAYS/PACK) IMPLANT
TROCAR BLADELESS 15MM (ENDOMECHANICALS) ×4 IMPLANT
TROCAR BLADELESS OPT 5 100 (ENDOMECHANICALS) ×8 IMPLANT
TROCAR UNIVERSAL OPT 12M 100M (ENDOMECHANICALS) ×12 IMPLANT
TROCAR XCEL 12X100 BLDLESS (ENDOMECHANICALS) ×4 IMPLANT
TUBING CONNECTING 10 (TUBING) ×12 IMPLANT
TUBING CONNECTING 10' (TUBING) ×4
TUBING ENDO SMARTCAP (MISCELLANEOUS) ×4 IMPLANT

## 2019-11-09 NOTE — Op Note (Signed)
Stephanie Herrera [id number] May 05, 1940. 11/09/2019  Preoperative diagnosis:    Obesity, morbid, BMI 54   History of migraine headaches   HLD (hyperlipidemia)   GERD (gastroesophageal reflux disease) Chronic left knee pain OSA on cpap   Postoperative  diagnosis:  1. Same + fatty liver  Surgical procedure: Laparoscopic Roux-en-Y gastric bypass (ante-colic, ante-gastric); upper endoscopy  Surgeon: Gayland Curry, M.D. FACS  Asst.: Romana Juniper MD FACS (an assistant was requested and required to help manipulate and retract tissue due to the complexity of the case)  Anesthesia: General plus exparel/marcaine mix  Complications: None   EBL: Minimal   Drains: None   Disposition: PACU in good condition   Indications for procedure: 49 y.o. yo female with morbid obesity who has been unsuccessful at sustained weight loss. The patient's comorbidities are listed above. We discussed the risk and benefits of surgery including but not limited to anesthesia risk, bleeding, infection, blood clot formation, anastomotic leak, anastomotic stricture, ulcer formation, death, respiratory complications, intestinal blockage, internal hernia, gallstone formation, vitamin and nutritional deficiencies, injury to surrounding structures, failure to lose weight and mood changes.   Description of procedure: Patient is brought to the operating room and general anesthesia induced. The patient had received preoperative broad-spectrum IV antibiotics and subcutaneous heparin. The abdomen was widely sterilely prepped with Chloraprep and draped. Patient timeout was performed and correct patient and procedure confirmed. Access was obtained with a 12 mm Optiview trocar in the left upper quadrant and pneumoperitoneum established without difficulty. Under direct vision 12 mm trocars were placed laterally in the right upper quadrant, right upper quadrant midclavicular line, and to the left and above the umbilicus for the  camera port. A 5 mm trocar was placed laterally in the left upper quadrant.  Exparel/marcaine mix was infiltrated in bilateral lateral abdominal walls as a TAP block.  The omentum was brought into the upper abdomen by myself and the assistant and the transverse mesocolon elevated by the assistant and the ligament of Treitz clearly identified. A 40 cm biliopancreatic limb was then carefully measured from the ligament of Treitz. The small intestine was divided at this point with a single firing of the white load linear stapler. A Penrose drain was sutured to the end of the Roux-en-Y limb for later identification. A 100 cm Roux-en-Y limb was then carefully measured. At this point a side-to-side anastomosis was created between the Roux limb and the end of the biliopancreatic limb. This was accomplished with a single firing of the 60 mm white load linear stapler. The common enterotomy was closed with a running 2-0 Vicryl begun at either end of the enterotomy and tied centrally. Eviceal tissue sealant was placed over the anastomosis. The mesenteric defect was then closed with running 2-0 silk with the aid of the assistant. The omentum was then divided with the harmonic scalpel up towards the transverse colon to allow mobility of the Roux limb toward the gastric pouch. The patient was then placed in steep reversed Trendelenburg. Through a 5 mm subxiphoid site the [facility] retractor was placed and the left lobe of the liver elevated with excellent exposure of the upper stomach and hiatus.  Patient had a fatty liver.  The angle of Hiss was then mobilized with the harmonic scalpel. A 5 cm gastric pouch was then carefully measured along the lesser curve of the stomach. Dissection was carried along the lesser curve at this point with the Harmonic scalpel working carefully back toward the lesser sac at right angles  to the lesser curve. The free lesser sac was then entered. After being sure all tubes were removed from the  stomach an initial firing of the gold load 60 mm linear stapler was fired at right angles across the lesser curve for about 4 cm. The gastric pouch was further mobilized posteriorly and then the pouch was completed with 4 further firings of the 60 mm blue load linear stapler up through the previously dissected angle of His. It was ensured that the pouch was completely mobilized away from the gastric remnant. This created a nice tubular 4-5 cm gastric pouch.  There is a little bit of bleeding along the pouch along the last staple line.  A clip was placed on the staple line for excellent hemostasis.  The Roux limb was then brought up in an antecolic fashion with the candycane facing to the patient's left without undue tension and held in place by the assistant. The gastrojejunostomy was created with an initial posterior row of 2-0 Vicryl between the Roux limb and the staple line of the gastric pouch. Enterotomies were then made in the gastric pouch and the Roux limb with the harmonic scalpel and at approximately 2-2-1/2 cm anastomosis was created with a single firing of the 72mm blue load linear stapler. The staple line was inspected and was intact without bleeding. The common enterotomy was then closed with running 2-0 Vicryl begun at either end and tied centrally. The Ewall tube was then easily passed through the anastomosis and an outer anterior layer of running 2-0 Vicryl was placed. The Ewald tube was removed. With the outlet of the gastrojejunostomy clamped and under saline irrigation the assistant performed upper endoscopy and with the gastric pouch tensely distended with air-there was no evidence of leak on this test. The pouch was desufflated. The Vonita Moss defect was closed with running 2-0 silk. The abdomen was inspected for any evidence of bleeding or bowel injury and everything looked fine. The Nathanson retractor was removed under direct vision after coating the anastomosis with Eviceal tissue sealant.  All CO2 was evacuated and trochars removed. Skin incisions were closed with 4-0 monocryl in a subcuticular fashion followed by benzoin, steri-strips and bandages. Sponge needle and instrument counts were correct. The patient was taken to the PACU in good condition.    Mary Sella. Andrey Campanile, MD, FACS General, Bariatric, & Minimally Invasive Surgery [facility], [place]

## 2019-11-09 NOTE — H&P (Signed)
Stephanie Herrera is an 49 y.o. female.   Chief Complaint: here for surgery HPI: 49 year old patient with severe obesity, hyper cholesterolemia, GERD, obstructive sleep apnea, chronic left knee pain presents for laparoscopic Roux-en-Y gastric bypass.  She denies any medical changes since I initially met her in the clinic in October 2020.  She did undergo a sleep study that showed moderate to severe sleep apnea.  She was started on CPAP.  She has been using it.  Otherwise she denies any medical changes since I saw her.  She denies any trips to the emergency room or hospital  Preoperative upper GI and chest x-ray were unremarkable.  Preoperative bariatric labs were unremarkable except for elevated lipids TC 327 and LDL 250.   The patient is a 49 year old female who presents for a bariatric surgery evaluation. She is referred by Dr Juleen China for evaluation of weight loss surgery. She completed her online seminar. She is debating between a sleeve gastrectomy but is leaning more toward a gastric bypass. She is interested in improving her overall health and being able to be more active with her grandchildren. Despite numerous attempts for sustained weight loss she has not had long-term success. She has worked with [employer] in the past 2 years, [facility], just recently started weight loss with her primary care doctor working on nutrition and physical activity, [facility] [employer] M.D. program-all without any long-term success  Her comorbidities include heartburn, and hypercholesterolemia with hypertriglyceridemia, chronic left knee pain  She denies any chest pain, chest pressure, shortness of breath, dyspnea on exertion. She may get some shortness of breath if she lays flat. She wakes up with a morning headaches generally. She takes HCTZ as needed for fluid in her hands. She denies any personal or family history of blood clots. She denies any angina, TIAs or amaurosis fugax. She  has daily heartburn but doesn't have to take anything necessarily for. She does take Tums as needed. She denies any sensation of food or liquids getting stuck. She has a daily bowel movement. No melena or hematochezia. Denies prior abdominal surgery. Not menstruating. Has had one child. No dysuria or hematuria. Has left lateral hip discomfort and left knee pain. Denies a diagnosis of diabetes. Has 2-3 migraines per month and may take something over-the-counter for it. No blurry vision.  No tobacco or drug use. Rare alcohol. Is a 59-year-old grandson. Is a patient care coordinator at [employer]  In 2019 had a triglyceride level of 211, total cholesterol level 303, LDL 213 Past Medical History:  Diagnosis Date  . 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 Avitaminosis D 03/02/2014  . GERD (gastroesophageal reflux disease)   . History of kidney stones   . HLD (hyperlipidemia) 06/18/2012  . Migraine   . Painful orthopaedic hardware ([facility]) removal, left foot   . Sleep apnea    cpap    Past Surgical History:  Procedure Laterality Date  . FOOT SURGERY Left 11/2013   Dr Doran Durand, tendon repair and lowered arch  ankle involved also  . HARDWARE REMOVAL Left 05/19/2015   Procedure: LEFT FOOT REMOVAL DEEP IMPLANT HARDWARE FROM CALCANEOUS AND FIRST METATARSAL;  Surgeon: Wylene Simmer, MD;  Location: [facility];  Service: Orthopedics;  Laterality: Left;  . KNEE ARTHROSCOPY Right   . TONSILLECTOMY     and adneoids    Family History  Problem Relation Age of Onset  . Hyperlipidemia Mother   . Lung cancer Father   .  Hemophilia Father   . HIV Father        from transfusion   Social History:  reports that she has never smoked. She has never used smokeless tobacco. She reports current alcohol use of about 1.0 standard drinks of alcohol per week. She reports that she does not use drugs.  Allergies:  Allergies  Allergen Reactions  . Hydrocodone Itching    She is able to tolerate  hydromorphone   . Iodinated Diagnostic Agents Hives, Shortness Of Breath and Swelling  . Simvastatin     Jaundice    Medications Prior to Admission  Medication Sig Dispense Refill  . calcium carbonate (TUMS - DOSED IN MG ELEMENTAL CALCIUM) 500 MG chewable tablet Chew 1 tablet by mouth daily.    . cholecalciferol (VITAMIN D3) 25 MCG (1000 UNIT) tablet Take 5,000 Units by mouth daily.    . Multiple Vitamins-Minerals (MULTIVITAMIN WITH MINERALS) tablet Take 1 tablet by mouth daily. chewable    . benzonatate (TESSALON) 100 MG capsule Take 1 capsule (100 mg total) by mouth 3 (three) times daily as needed for cough. (Patient not taking: Reported on 10/28/2019) 30 capsule 0  . fluticasone (FLONASE) 50 MCG/ACT nasal spray SPRAY 2 SPRAYS INTO EACH NOSTRIL EVERY DAY (Patient not taking: Reported on 10/28/2019) 16 mL 0  . hydrochlorothiazide (HYDRODIURIL) 25 MG tablet Take 25 mg by mouth daily as needed (fluid).       Results for orders placed or performed during the hospital encounter of 11/09/19 (from the past 48 hour(s))  Pregnancy, urine     Status: None   Collection Time: 11/09/19  8:00 AM  Result Value Ref Range   Preg Test, Ur NEGATIVE NEGATIVE    Comment:        THE SENSITIVITY OF THIS METHODOLOGY IS >20 mIU/mL. Performed at [facility], [place] [address] [place], [place] [address]    No results found.  Review of Systems  All other systems reviewed and are negative.   Blood pressure 131/74, pulse 67, temperature 98.2 F (36.8 C), temperature source Oral, resp. rate 18, height 5' 3"  (1.6 m), weight (!) 138.3 kg, SpO2 100 %. Physical Exam  Vitals reviewed. Constitutional: She is oriented to person, place, and time. She appears well-developed and well-nourished. No distress.  Severe obesity  HENT:  Head: Normocephalic and atraumatic.  Right Ear: External ear normal.  Left Ear: External ear normal.  Eyes: Conjunctivae are normal. No scleral icterus.  Neck: No  tracheal deviation present. No thyromegaly present.  Cardiovascular: Normal rate and normal heart sounds.  Respiratory: Effort normal and breath sounds normal. No stridor. No respiratory distress. She has no wheezes.  GI: Soft. She exhibits no distension. There is no abdominal tenderness. There is no rebound.  Musculoskeletal:        General: No tenderness or edema.     Cervical back: Normal range of motion and neck supple.  Lymphadenopathy:    She has no cervical adenopathy.  Neurological: She is alert and oriented to person, place, and time. She exhibits normal muscle tone.  Skin: Skin is warm and dry. No rash noted. She is not diaphoretic. No erythema. No pallor.  Psychiatric: She has a normal mood and affect. Her behavior is normal. Judgment and thought content normal.     Assessment/Plan 49 year old female presents for laparoscopic Roux-en-Y gastric bypass  Severe obesity Obstructive sleep apnea GERD Hypercholesterolemia Chronic left knee pain  She has completed her preoperative bariatric pathway.  She has received  psychological clearance.  She has completed her preoperative education classes.  I had previously discussed Roux-en-Y gastric bypass surgery with the patient during her initial visit to the clinic.  We rediscussed risk and benefits of the procedure today including but not limited to bleeding, infection, injury to surrounding structures, blood clot, reoperation, readmission, leak, perioperative cardiac and pulmonary events, death, need for vitamin supplementation, marginal ulcer.  We rediscussed the typical postoperative pathway.  All of her questions were asked and answered  Leighton Ruff. Redmond Pulling, MD, FACS General, Bariatric, & Minimally Invasive Surgery [facility], [place]  Greer Pickerel, MD 11/09/2019, 11:13 AM

## 2019-11-09 NOTE — Op Note (Signed)
Preoperative diagnosis: Roux-en-Y gastric bypass  Postoperative diagnosis: Same   Procedure: Upper endoscopy   Surgeon: Berna Bue, M.D.  Anesthesia: Gen.   Description of procedure: The endoscope was placed in the mouth oropharynx and under endoscopic vision it was advanced to the esophagogastric junction which was identified at 34cm from the teeth.  With the roux limb occluded, the pouch was tensely insufflated while the upper abdomen was flooded with irrigation to perform a leak test, which was negative. No bubbles were seen.  The staple line and anastomosis were hemostatic and the pouch is 5cm in length/ anastomosis measured at 39cm from the teeth. The lumen was decompressed and the scope was withdrawn without difficulty.    Berna Bue, M.D. General, Bariatric, & Minimally Invasive Surgery [facility]

## 2019-11-09 NOTE — Anesthesia Postprocedure Evaluation (Signed)
Anesthesia Post Note  Patient: Stephanie Herrera  Procedure(s) Performed: LAPAROSCOPIC ROUX-EN-Y GASTRIC BYPASS WITH UPPER ENDOSCOPY (Abdomen)     Patient location during evaluation: PACU Anesthesia Type: General Level of consciousness: awake Pain management: pain level controlled Vital Signs Assessment: post-procedure vital signs reviewed and stable Respiratory status: spontaneous breathing Cardiovascular status: stable Postop Assessment: no apparent nausea or vomiting Anesthetic complications: no    Last Vitals:  Vitals:   11/09/19 1430 11/09/19 1445  BP: (!) 143/57   Pulse: 87 78  Resp:  17  Temp:    SpO2: 100% 97%    Last Pain:  Vitals:   11/09/19 1455  TempSrc:   PainSc: 7                  Maliq Pilley

## 2019-11-09 NOTE — Anesthesia Preprocedure Evaluation (Addendum)
Anesthesia Evaluation  Patient identified by MRN, date of birth, ID band Patient awake    Reviewed: Allergy & Precautions, NPO status , Patient's Chart, lab work & pertinent test results  Airway Mallampati: II  TM Distance: >3 FB Neck ROM: Full    Dental no notable dental hx.    Pulmonary sleep apnea and Continuous Positive Airway Pressure Ventilation ,    Pulmonary exam normal breath sounds clear to auscultation       Cardiovascular negative cardio ROS Normal cardiovascular exam Rhythm:Regular Rate:Normal  ECG: NSR, rate 80   Neuro/Psych  Headaches, negative psych ROS   GI/Hepatic negative GI ROS, Neg liver ROS,   Endo/Other  Morbid obesity (SUPER)  Renal/GU negative Renal ROS     Musculoskeletal negative musculoskeletal ROS (+)   Abdominal (+) + obese,   Peds  Hematology negative hematology ROS (+)   Anesthesia Other Findings Morbid Obesity  Reproductive/Obstetrics hcg negative                           Anesthesia Physical Anesthesia Plan  ASA: IV  Anesthesia Plan: General   Post-op Pain Management:    Induction: Intravenous  PONV Risk Score and Plan: 4 or greater and Ondansetron, Dexamethasone, Midazolam, Treatment may vary due to age or medical condition and Scopolamine patch - Pre-op  Airway Management Planned: Oral ETT and Video Laryngoscope Planned  Additional Equipment:   Intra-op Plan:   Post-operative Plan: Extubation in OR  Informed Consent: I have reviewed the patients History and Physical, chart, labs and discussed the procedure including the risks, benefits and alternatives for the proposed anesthesia with the patient or authorized representative who has indicated his/her understanding and acceptance.     Dental advisory given  Plan Discussed with: CRNA  Anesthesia Plan Comments:       Anesthesia Quick Evaluation

## 2019-11-09 NOTE — Transfer of Care (Signed)
Immediate Anesthesia Transfer of Care Note  Patient: Stephanie Herrera  Procedure(s) Performed: LAPAROSCOPIC ROUX-EN-Y GASTRIC BYPASS WITH UPPER ENDOSCOPY (Abdomen)  Patient Location: PACU  Anesthesia Type:General  Level of Consciousness: sedated  Airway & Oxygen Therapy: Patient Spontanous Breathing and Patient connected to face mask oxygen  Post-op Assessment: Report given to RN and Post -op Vital signs reviewed and stable  Post vital signs: Reviewed and stable  Last Vitals:  Vitals Value Taken Time  BP 145/72 11/09/19 1408  Temp    Pulse 94 11/09/19 1409  Resp 16 11/09/19 1409  SpO2 100 % 11/09/19 1409  Vitals shown include unvalidated device data.  Last Pain:  Vitals:   11/09/19 0810  TempSrc: Oral  PainSc: 0-No pain         Complications: No apparent anesthesia complications

## 2019-11-09 NOTE — Progress Notes (Signed)
Water started. 

## 2019-11-09 NOTE — Anesthesia Procedure Notes (Signed)
Procedure Name: Intubation Date/Time: 11/09/2019 11:33 AM Performed by: Jhonnie Garner, CRNA Pre-anesthesia Checklist: Patient identified, Emergency Drugs available, Suction available and Patient being monitored Patient Re-evaluated:Patient Re-evaluated prior to induction Oxygen Delivery Method: Circle system utilized Preoxygenation: Pre-oxygenation with 100% oxygen Induction Type: IV induction Ventilation: Mask ventilation without difficulty and Oral airway inserted - appropriate to patient size Laryngoscope Size: Glidescope (LoPro 3) Grade View: Grade I Tube type: Oral Tube size: 7.5 mm Number of attempts: 1 Airway Equipment and Method: Stylet and Video-laryngoscopy Placement Confirmation: ETT inserted through vocal cords under direct vision,  positive ETCO2 and breath sounds checked- equal and bilateral Secured at: 21 cm Tube secured with: Tape Dental Injury: Teeth and Oropharynx as per pre-operative assessment

## 2019-11-09 NOTE — Plan of Care (Signed)
  Problem: Education: Goal: Knowledge of discharge needs will improve Outcome: Progressing   Problem: Activity: Goal: Ability to tolerate increased activity will improve Outcome: Progressing   Problem: Fluid Volume: Goal: Maintenance of adequate hydration will improve Outcome: Progressing   Problem: Nutritional: Goal: Nutritional status will improve Outcome: Progressing   Problem: Respiratory: Goal: Will regain and/or maintain adequate ventilation Outcome: Progressing   Problem: Pain Management: Goal: Pain level will decrease Outcome: Progressing

## 2019-11-09 NOTE — Progress Notes (Signed)
NUTRITION NOTE  Consult received for Bariatric Diet teaching. Patient is POD #0 lap Roux-en-Y gastric bypass and upper endoscopy. Bariatric Nurse Educator to provide all post-op/pre-discharge instructions, including diet-related instruction. Patient was seen by Bariatric Nurse Educator a short time ago.   If additional nutrition-related needs arise, please re-consult RD.    Trenton Gammon, MS, RD, LDN, CNSC Inpatient Clinical Dietitian RD pager # available in [facility]  After hours/weekend pager # available in [facility]

## 2019-11-09 NOTE — Discharge Instructions (Signed)
° ° ° °GASTRIC BYPASS/SLEEVE ° Home Care Instructions ° ° These instructions are to help you care for yourself when you go home. ° °Call: If you have any problems. °• Call [phone] and ask for the surgeon on call °• If you need immediate help, come to the ER at [facility].  °• Tell the ER staff that you are a new post-op gastric bypass or gastric sleeve patient °  °Signs and symptoms to report: • Severe vomiting or nausea °o If you cannot keep down clear liquids for longer than 1 day, call your surgeon  °• Abdominal pain that does not get better after taking your pain medication °• Fever over 100.4° F with chills °• Heart beating over 100 beats a minute °• Shortness of breath at rest °• Chest pain °•  Redness, swelling, drainage, or foul odor at incision (surgical) sites °•  If your incisions open or pull apart °• Swelling or pain in calf (lower leg) °• Diarrhea (Loose bowel movements that happen often), frequent watery, uncontrolled bowel movements °• Constipation, (no bowel movements for 3 days) if this happens: Pick one °o Milk of Magnesia, 2 tablespoons by mouth, 3 times a day for 2 days if needed °o Stop taking Milk of Magnesia once you have a bowel movement °o Call your doctor if constipation continues °Or °o Miralax  (instead of Milk of Magnesia) following the label instructions °o Stop taking Miralax once you have a bowel movement °o Call your doctor if constipation continues °• Anything you think is not normal °  °Normal side effects after surgery: • Unable to sleep at night or unable to focus °• Irritability or moody °• Being tearful (crying) or depressed °These are common complaints, possibly related to your anesthesia medications that put you to sleep, stress of surgery, and change in lifestyle.  This usually goes away a few weeks after surgery.  If these feelings continue, call your primary care doctor. °  °Wound Care: You may have surgical glue, steri-strips, or staples over your incisions after  surgery °• Surgical glue:  Looks like a clear film over your incisions and will wear off a little at a time °• Steri-strips: Strips of tape over your incisions. You may notice a yellowish color on the skin under the steri-strips. This is used to make the   steri-strips stick better. Do not pull the steri-strips off - let them fall off °• Staples: Staples may be removed before you leave the hospital °o If you go home with staples, call [facility], [phone] at for an appointment with your surgeon’s nurse to have staples removed 10 days after surgery. °• Showering: You may shower two (2) days after your surgery unless your surgeon tells you differently °o Wash gently around incisions with warm soapy water, rinse well, and gently pat dry  °o No tub baths until staples are removed, steri-strips fall off or glue is gone.  °  °Medications: • Medications should be liquid or crushed if larger than the size of a dime °• Extended release pills (medication that release a little bit at a time through the day) should NOT be crushed or cut. (examples include XL, ER, DR, SR) °• Depending on the size and number of medications you take, you may need to space (take a few throughout the day)/change the time you take your medications so that you do not over-fill your pouch (smaller stomach) °• Make sure you follow-up with your primary care doctor to   make medication changes needed during rapid weight loss and life-style changes °• If you have diabetes, follow up with the doctor that orders your diabetes medication(s) within one week after surgery and check your blood sugar regularly. °• Do not drive while taking prescription pain medication  °• It is ok to take Tylenol by the bottle instructions with your pain medicine or instead of your pain medicine as needed.  DO NOT TAKE NSAIDS (EXAMPLES OF NSAIDS:  IBUPROFREN/ NAPROXEN)  °Diet:                    First 2 Weeks ° You will see the dietician t about two (2) weeks  after your surgery. The dietician will increase the types of foods you can eat if you are handling liquids well: °• If you have severe vomiting or nausea and cannot keep down clear liquids lasting longer than 1 day, call your surgeon @ ([phone]) °Protein Shake °• Drink at least 2 ounces of shake 5-6 times per day °• Each serving of protein shakes (usually 8 - 12 ounces) should have: °o 15 grams of protein  °o And no more than 5 grams of carbohydrate  °• Goal for protein each day: °o Men = 80 grams per day °o Women = 60 grams per day °• Protein powder may be added to fluids such as non-fat milk or Lactaid milk or unsweetened Soy/Almond milk (limit to 35 grams added protein powder per serving) ° °Hydration °• Slowly increase the amount of water and other clear liquids as tolerated (See Acceptable Fluids) °• Slowly increase the amount of protein shake as tolerated  °•  Sip fluids slowly and throughout the day.  Do not use straws. °• May use sugar substitutes in small amounts (no more than 6 - 8 packets per day; i.e. Splenda) ° °Fluid Goal °• The first goal is to drink at least 8 ounces of protein shake/drink per day (or as directed by the nutritionist); some examples of protein shakes are Syntrax Nectar, Adkins Advantage, EAS Edge HP, and Unjury. See handout from pre-op Bariatric Education Class: °o Slowly increase the amount of protein shake you drink as tolerated °o You may find it easier to slowly sip shakes throughout the day °o It is important to get your proteins in first °• Your fluid goal is to drink 64 - 100 ounces of fluid daily °o It may take a few weeks to build up to this °• 32 oz (or more) should be clear liquids  °And  °• 32 oz (or more) should be full liquids (see below for examples) °• Liquids should not contain sugar, caffeine, or carbonation ° °Clear Liquids: °• Water or Sugar-free flavored water (i.e. Fruit H2O, Propel) °• Decaffeinated coffee or tea (sugar-free) °• Crystal Lite, Wyler’s Lite,  Minute Maid Lite °• Sugar-free Jell-O °• Bouillon or broth °• Sugar-free Popsicle:   *Less than 20 calories each; Limit 1 per day ° °Full Liquids: °Protein Shakes/Drinks + 2 choices per day of other full liquids °• Full liquids must be: °o No More Than 15 grams of Carbs per serving  °o No More Than 3 grams of Fat per serving °• Strained low-fat cream soup (except Cream of Potato or Tomato) °• Non-Fat milk °• Fat-free Lactaid Milk °• Unsweetened Soy Or Unsweetened Almond Milk °• Low Sugar yogurt (Dannon Lite & Fit, Greek yogurt; Oikos Triple Zero; Chobani Simply 100; Yoplait 100 calorie Greek - No Fruit on the Bottom) ° °  °Vitamins   and Minerals • Start 1 day after surgery unless otherwise directed by your surgeon °• 2 Chewable Bariatric Specific Multivitamin / Multimineral Supplement with iron (Example: Bariatric Advantage Multi EA) °• Chewable Calcium with Vitamin D-3 °(Example: 3 Chewable Calcium Plus 600 with Vitamin D-3) °o Take 500 mg three (3) times a day for a total of 1500 mg each day °o Do not take all 3 doses of calcium at one time as it may cause constipation, and you can only absorb 500 mg  at a time  °o Do not mix multivitamins containing iron with calcium supplements; take 2 hours apart °• Menstruating women and those with a history of anemia (a blood disease that causes weakness) may need extra iron °o Talk with your doctor to see if you need more iron °• Do not stop taking or change any vitamins or minerals until you talk to your dietitian or surgeon °• Your Dietitian and/or surgeon must approve all vitamin and mineral supplements °  °Activity and Exercise: Limit your physical activity as instructed by your doctor.  It is important to continue walking at home.  During this time, use these guidelines: °• Do not lift anything greater than ten (10) pounds for at least two (2) weeks °• Do not go back to work or drive until your surgeon says you can °• You may have sex when you feel comfortable  °o It is  VERY important for female patients to use a reliable birth control method; fertility often increases after surgery  °o All hormonal birth control will be ineffective for 30 days after surgery due to medications given during surgery a barrier method must be used. °o Do not get pregnant for at least 18 months °• Start exercising as soon as your doctor tells you that you can °o Make sure your doctor approves any physical activity °• Start with a simple walking program °• Walk 5-15 minutes each day, 7 days per week.  °• Slowly increase until you are walking 30-45 minutes per day °Consider joining our [employer] program. [phone] or email [email or web address] °  °Special Instructions Things to remember: °• Use your CPAP when sleeping if this applies to you ° °• [facility] has two free Bariatric Surgery Support Groups that meet monthly °o The 3rd Thursday of each month, 6 pm, [facility]  °o The 2nd Friday of each month, 11:45 am in the private dining room in the basement of  °• It is very important to keep all follow up appointments with your surgeon, dietitian, primary care physician, and behavioral health practitioner °• Routine follow up schedule with your surgeon include appointments at 2-3 weeks, 6-8 weeks, 6 months, and 1 year at a minimum.  Your surgeon may request to see you more often.   °o After the first year, please follow up with your bariatric surgeon and dietitian at least once a year in order to maintain best weight loss results °[facility]: [phone] °[facility]: [phone] °Bariatric Nurse Coordinator: [phone] °  °   Reviewed and Endorsed  °by [place] Patient Education Committee, June, 2016 °Edits Approved: Aug, 2018 ° ° ° °

## 2019-11-09 NOTE — Progress Notes (Signed)
PHARMACY CONSULT FOR:  Risk Assessment for Post-Discharge VTE Following Bariatric Surgery  Post-Discharge VTE Risk Assessment: This patient's probability of 30-day post-discharge VTE is increased due to the factors marked:   Female    Age >/=60 years   x BMI >/=50 kg/m2    CHF    Dyspnea at Rest    Paraplegia   x Non-gastric-band surgery    Operation Time >/=3 hr    Return to OR     Length of Stay >/= 3 d      Hx of VTE   Hypercoagulable condition   Significant venous stasis   Predicted probability of 30-day post-discharge VTE: 0.27%  Recommendation for Discharge: No pharmacologic prophylaxis post-discharge  Stephanie Herrera is a 49 y.o. 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 Hydrocodone Itching    She is able to tolerate hydromorphone   . Iodinated Diagnostic Agents Hives, Shortness Of Breath and Swelling  . Simvastatin     Jaundice    Patient Measurements: Height: 5\' 3"  (160 cm) Weight: (!) 138.3 kg (305 lb) IBW/kg (Calculated) : 52.4 Body mass index is 54.03 kg/m.  No results for input(s): WBC, HGB, HCT, PLT, APTT, CREATININE, LABCREA, CREATININE, CREAT24HRUR, MG, PHOS, ALBUMIN, PROT, ALBUMIN, AST, ALT, ALKPHOS, BILITOT, BILIDIR, IBILI in the last 72 hours. Estimated Creatinine Clearance: 116.6 mL/min (by C-G formula based on SCr of 0.68 mg/dL).    Past Medical History:  Diagnosis Date  . 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 Avitaminosis D 03/02/2014  . GERD (gastroesophageal reflux disease)   . History of kidney stones   . HLD (hyperlipidemia) 06/18/2012  . Migraine   . Painful orthopaedic hardware ([facility]) removal, left foot   . Sleep apnea    cpap     Medications Prior to Admission  Medication Sig Dispense Refill Last Dose  . calcium carbonate (TUMS - DOSED IN MG ELEMENTAL CALCIUM) 500 MG chewable tablet Chew 1 tablet by mouth daily.    11/06/2019  . cholecalciferol (VITAMIN D3) 25 MCG (1000 UNIT) tablet Take 5,000 Units by mouth daily.     . Multiple Vitamins-Minerals (MULTIVITAMIN WITH MINERALS) tablet Take 1 tablet by mouth daily. chewable   11/08/2019 at Unknown time  . benzonatate (TESSALON) 100 MG capsule Take 1 capsule (100 mg total) by mouth 3 (three) times daily as needed for cough. (Patient not taking: Reported on 10/28/2019) 30 capsule 0 Not Taking at Unknown time  . fluticasone (FLONASE) 50 MCG/ACT nasal spray SPRAY 2 SPRAYS INTO EACH NOSTRIL EVERY DAY (Patient not taking: Reported on 10/28/2019) 16 mL 0 Not Taking at Unknown time  . hydrochlorothiazide (HYDRODIURIL) 25 MG tablet Take 25 mg by mouth daily as needed (fluid).    More than a month at Unknown time     10/30/2019, PharmD 11/09/2019,2:39 PM

## 2019-11-09 NOTE — Progress Notes (Signed)
Discussed post op day goals with patient including ambulation, IS, diet progression, pain, and nausea control.  BSTOP education provided including BSTOP information guide, "Guide for Pain Management after your Bariatric Procedure".  Questions answered. 

## 2019-11-10 LAB — CBC WITH DIFFERENTIAL/PLATELET
Abs Immature Granulocytes: 0.03 10*3/uL (ref 0.00–0.07)
Basophils Absolute: 0 10*3/uL (ref 0.0–0.1)
Basophils Relative: 0 %
Eosinophils Absolute: 0 10*3/uL (ref 0.0–0.5)
Eosinophils Relative: 0 %
HCT: 39.5 % (ref 36.0–46.0)
Hemoglobin: 13.2 g/dL (ref 12.0–15.0)
Immature Granulocytes: 0 %
Lymphocytes Relative: 14 %
Lymphs Abs: 1.3 10*3/uL (ref 0.7–4.0)
MCH: 32.4 pg (ref 26.0–34.0)
MCHC: 33.4 g/dL (ref 30.0–36.0)
MCV: 97.1 fL (ref 80.0–100.0)
Monocytes Absolute: 0.6 10*3/uL (ref 0.1–1.0)
Monocytes Relative: 6 %
Neutro Abs: 7.6 10*3/uL (ref 1.7–7.7)
Neutrophils Relative %: 80 %
Platelets: 250 10*3/uL (ref 150–400)
RBC: 4.07 MIL/uL (ref 3.87–5.11)
RDW: 12.5 % (ref 11.5–15.5)
WBC: 9.4 10*3/uL (ref 4.0–10.5)
nRBC: 0 % (ref 0.0–0.2)

## 2019-11-10 LAB — COMPREHENSIVE METABOLIC PANEL
ALT: 29 U/L (ref 0–44)
AST: 24 U/L (ref 15–41)
Albumin: 3.8 g/dL (ref 3.5–5.0)
Alkaline Phosphatase: 61 U/L (ref 38–126)
Anion gap: 6 (ref 5–15)
BUN: 10 mg/dL (ref 6–20)
CO2: 27 mmol/L (ref 22–32)
Calcium: 9 mg/dL (ref 8.9–10.3)
Chloride: 104 mmol/L (ref 98–111)
Creatinine, Ser: 0.65 mg/dL (ref 0.44–1.00)
GFR calc Af Amer: >60 mL/min (ref >60)
GFR calc non Af Amer: >60 mL/min (ref >60)
Glucose, Bld: 159 mg/dL — ABNORMAL HIGH (ref 70–99)
Potassium: 4.3 mmol/L (ref 3.5–5.1)
Sodium: 137 mmol/L (ref 135–145)
Total Bilirubin: 0.7 mg/dL (ref 0.3–1.2)
Total Protein: 7 g/dL (ref 6.5–8.1)

## 2019-11-10 MED ORDER — GABAPENTIN 100 MG PO CAPS
200.0000 mg | ORAL_CAPSULE | Freq: Two times a day (BID) | ORAL | 0 refills | Status: DC
Start: 2019-11-10 — End: 2019-12-17

## 2019-11-10 MED ORDER — ONDANSETRON 4 MG PO TBDP
4.0000 mg | ORAL_TABLET | Freq: Four times a day (QID) | ORAL | 0 refills | Status: DC | PRN
Start: 2019-11-10 — End: 2019-12-17

## 2019-11-10 MED ORDER — PANTOPRAZOLE SODIUM 40 MG PO TBEC
40.0000 mg | DELAYED_RELEASE_TABLET | Freq: Every day | ORAL | 0 refills | Status: DC
Start: 2019-11-10 — End: 2019-12-17

## 2019-11-10 MED ORDER — ACETAMINOPHEN 500 MG PO TABS
1000.0000 mg | ORAL_TABLET | Freq: Three times a day (TID) | ORAL | 0 refills | Status: AC
Start: 2019-11-10 — End: 2019-11-15

## 2019-11-10 NOTE — Plan of Care (Signed)
  Problem: Education: Goal: Ability to state signs and symptoms to report to health care provider will improve Outcome: Progressing Goal: Knowledge of the prescribed self-care regimen will improve Outcome: Progressing Goal: Knowledge of discharge needs will improve Outcome: Progressing   Problem: Activity: Goal: Ability to tolerate increased activity will improve Outcome: Progressing

## 2019-11-10 NOTE — Discharge Summary (Signed)
Physician Discharge Summary  Stephanie Herrera KGM:[id number] DOB: 21-Jan-1971 DOA: 11/09/2019  PCP: Inda Coke, PA  Admit date: 11/09/2019 Discharge date: 11/10/2019  Recommendations for Outpatient Follow-up:    Follow-up Information    Greer Pickerel, MD. Go on 11/25/2019.   Specialty: General Surgery Why: at 830 am.  Please arrive 15 minutes prior to appointment time.  Thank you Contact information: [address] [place] [address] [phone]        Carlena Hurl, PA-C. Go on 12/28/2019.   Specialty: General Surgery Why: at 415.  Please arrive 15 minutes prior to appointment time.  Thank you Contact information: [place] [address] [phone]          Discharge Diagnoses:  Principal Problem:   Obesity, morbid, BMI 50 or higher ([place]) Active Problems:   History of migraine headaches   HLD (hyperlipidemia)   GERD (gastroesophageal reflux disease)   S/P gastric bypass   Surgical Procedure: Laparoscopic Roux-en-Y gastric bypass, upper endoscopy  Discharge Condition: Good Disposition: Home  Diet recommendation: Postoperative gastric bypass diet  Filed Weights   11/09/19 0810  Weight: (!) 138.3 kg     Hospital Course:  The patient was admitted for a planned laparoscopic Roux-en-Y gastric bypass. Please see operative note. Preoperatively the patient was given 5000 units of subcutaneous heparin for DVT prophylaxis. ERAS protocol was used. Postoperative prophylactic Lovenox dosing was started on the evening of postoperative day 0.  The patient was started on ice chips and water on the evening of POD 0 which they tolerated. On postoperative day 1 The patient's diet was advanced to protein shakes which they also tolerated. On POD 1, The patient was ambulating without difficulty. Their vital signs are stable without fever or tachycardia. Their hemoglobin had remained stable. The patient was maintained on their home settings for  CPAP therapy. The patient had received discharge instructions and counseling. They were deemed stable for discharge.  BP (!) 159/89   Pulse 70   Temp 98.1 F (36.7 C) (Oral)   Resp 16   Ht 5\' 3"  (1.6 m)   Wt (!) 138.3 kg   SpO2 94%   BMI 54.03 kg/m   Gen: alert, NAD, non-toxic appearing Pupils: equal, no scleral icterus Pulm: Lungs clear to auscultation, symmetric chest rise CV: regular rate and rhythm Abd: soft, min tender, nondistended. No cellulitis. No incisional hernia Ext: no edema, no calf tenderness Skin: no rash, no jaundice  Discharge Instructions  Discharge Instructions    Ambulate hourly while awake   Complete by: As directed    Call MD for:  difficulty breathing, headache or visual disturbances   Complete by: As directed    Call MD for:  persistant dizziness or light-headedness   Complete by: As directed    Call MD for:  persistant nausea and vomiting   Complete by: As directed    Call MD for:  redness, tenderness, or signs of infection (pain, swelling, redness, odor or green/yellow discharge around incision site)   Complete by: As directed    Call MD for:  severe uncontrolled pain   Complete by: As directed    Call MD for:  temperature >101 F   Complete by: As directed    Diet bariatric full liquid   Complete by: As directed    Discharge instructions   Complete by: As directed    See bariatric discharge instructions   Incentive spirometry   Complete by: As directed  Perform hourly while awake     Allergies as of 11/10/2019      Reactions   Hydrocodone Itching   She is able to tolerate hydromorphone    Iodinated Diagnostic Agents Hives, Shortness Of Breath, Swelling   Simvastatin    Jaundice      Medication List    STOP taking these medications   benzonatate 100 MG capsule Commonly known as: TESSALON   fluticasone 50 MCG/ACT nasal spray Commonly known as: FLONASE     TAKE these medications   acetaminophen 500 MG tablet Commonly known  as: TYLENOL Take 2 tablets (1,000 mg total) by mouth every 8 (eight) hours for 5 days.   calcium carbonate 500 MG chewable tablet Commonly known as: TUMS - dosed in mg elemental calcium Chew 1 tablet by mouth daily.   cholecalciferol 25 MCG (1000 UNIT) tablet Commonly known as: VITAMIN D3 Take 5,000 Units by mouth daily.   gabapentin 100 MG capsule Commonly known as: NEURONTIN Take 2 capsules (200 mg total) by mouth every 12 (twelve) hours.   hydrochlorothiazide 25 MG tablet Commonly known as: HYDRODIURIL Take 25 mg by mouth daily as needed (fluid). Notes to patient: Monitor Blood Pressure Daily and keep a log for primary care physician.  Monitor for symptoms of dehydration.  You may need to make changes to your medications with rapid weight loss.     multivitamin with minerals tablet Take 1 tablet by mouth daily. chewable   ondansetron 4 MG disintegrating tablet Commonly known as: ZOFRAN-ODT Take 1 tablet (4 mg total) by mouth every 6 (six) hours as needed for nausea or vomiting.   pantoprazole 40 MG tablet Commonly known as: PROTONIX Take 1 tablet (40 mg total) by mouth daily.      Follow-up Information    Gaynelle Adu, MD. Go on 11/25/2019.   Specialty: General Surgery Why: at 830 am.  Please arrive 15 minutes prior to appointment time.  Thank you Contact information: [address] [place] [address] [phone]        Hedda Slade, PA-C. Go on 12/28/2019.   Specialty: General Surgery Why: at 415.  Please arrive 15 minutes prior to appointment time.  Thank you Contact information: [address] [place] [address] [phone]            The results of significant diagnostics from this hospitalization (including imaging, microbiology, ancillary and laboratory) are listed below for reference.    Significant Diagnostic Studies: No results found.  Labs: Basic Metabolic Panel: Recent Labs  Lab 11/05/19 1046 11/10/19 0431  NA  137 137  K 4.1 4.3  CL 103 104  CO2 27 27  GLUCOSE 99 159*  BUN 20 10  CREATININE 0.68 0.65  CALCIUM 9.0 9.0   Liver Function Tests: Recent Labs  Lab 11/05/19 1046 11/10/19 0431  AST 16 24  ALT 22 29  ALKPHOS 68 61  BILITOT 0.6 0.7  PROT 7.0 7.0  ALBUMIN 4.0 3.8    CBC: Recent Labs  Lab 11/05/19 1046 11/09/19 1438 11/10/19 0431  WBC 8.1  --  9.4  NEUTROABS 4.8  --  7.6  HGB 14.1 13.9 13.2  HCT 41.9 42.0 39.5  MCV 95.4  --  97.1  PLT 248  --  250    CBG: No results for input(s): GLUCAP in the last 168 hours.  Principal Problem:   Obesity, morbid, BMI 50 or higher (HCC) Active Problems:   History of migraine headaches  HLD (hyperlipidemia)   GERD (gastroesophageal reflux disease)   S/P gastric bypass   Time coordinating discharge: 15 min  Signed:  Atilano Ina, MD [facility], [place] [phone] 11/10/2019, 2:41 PM

## 2019-11-10 NOTE — Plan of Care (Signed)
  Problem: Education: Goal: Ability to state signs and symptoms to report to health care provider will improve 11/10/2019 1151 by Glennon Mac, RN Outcome: Adequate for Discharge 11/10/2019 1001 by Glennon Mac, RN Outcome: Progressing Goal: Knowledge of the prescribed self-care regimen will improve 11/10/2019 1151 by Glennon Mac, RN Outcome: Adequate for Discharge 11/10/2019 1001 by Glennon Mac, RN Outcome: Progressing Goal: Knowledge of discharge needs will improve 11/10/2019 1151 by Glennon Mac, RN Outcome: Adequate for Discharge 11/10/2019 1001 by Glennon Mac, RN Outcome: Progressing   Problem: Activity: Goal: Ability to tolerate increased activity will improve 11/10/2019 1151 by Glennon Mac, RN Outcome: Adequate for Discharge 11/10/2019 1001 by Glennon Mac, RN Outcome: Progressing   Problem: Bowel/Gastric: Goal: Gastrointestinal status for postoperative course will improve Outcome: Adequate for Discharge Goal: Occurrences of nausea will decrease Outcome: Adequate for Discharge   Problem: Coping: Goal: Development of coping mechanisms to deal with changes in body function or appearance will improve Outcome: Adequate for Discharge   Problem: Fluid Volume: Goal: Maintenance of adequate hydration will improve Outcome: Adequate for Discharge   Problem: Nutritional: Goal: Nutritional status will improve Outcome: Adequate for Discharge   Problem: Clinical Measurements: Goal: Will show no signs or symptoms of venous thromboembolism Outcome: Adequate for Discharge Goal: Will remain free from infection Outcome: Adequate for Discharge Goal: Will show no signs of GI Leak Outcome: Adequate for Discharge   Problem: Respiratory: Goal: Will regain and/or maintain adequate ventilation Outcome: Adequate for Discharge   Problem: Pain Management: Goal: Pain level will decrease Outcome: Adequate for Discharge   Problem: Skin  Integrity: Goal: Demonstration of wound healing without infection will improve Outcome: Adequate for Discharge   Problem: Education: Goal: Knowledge of General Education information will improve Description: Including pain rating scale, medication(s)/side effects and non-pharmacologic comfort measures Outcome: Adequate for Discharge   Problem: Health Behavior/Discharge Planning: Goal: Ability to manage health-related needs will improve Outcome: Adequate for Discharge   Problem: Clinical Measurements: Goal: Ability to maintain clinical measurements within normal limits will improve Outcome: Adequate for Discharge Goal: Will remain free from infection Outcome: Adequate for Discharge Goal: Diagnostic test results will improve Outcome: Adequate for Discharge Goal: Respiratory complications will improve Outcome: Adequate for Discharge Goal: Cardiovascular complication will be avoided Outcome: Adequate for Discharge   Problem: Activity: Goal: Risk for activity intolerance will decrease Outcome: Adequate for Discharge   Problem: Nutrition: Goal: Adequate nutrition will be maintained Outcome: Adequate for Discharge   Problem: Coping: Goal: Level of anxiety will decrease Outcome: Adequate for Discharge   Problem: Elimination: Goal: Will not experience complications related to bowel motility Outcome: Adequate for Discharge Goal: Will not experience complications related to urinary retention Outcome: Adequate for Discharge   Problem: Pain Managment: Goal: General experience of comfort will improve Outcome: Adequate for Discharge   Problem: Safety: Goal: Ability to remain free from injury will improve Outcome: Adequate for Discharge   Problem: Skin Integrity: Goal: Risk for impaired skin integrity will decrease Outcome: Adequate for Discharge

## 2019-11-10 NOTE — Progress Notes (Signed)
Patient alert and oriented, pain is controlled. Patient is tolerating fluids, advanced to protein shake today, patient is tolerating well. Reviewed Gastric Bypass discharge instructions with patient and patient is able to articulate understanding. Provided information on BELT program, Support Group and [facility] outpatient pharmacy. All questions answered, will continue to monitor.   Total fluid intake 810 Per dehydration protocol call back one week post op

## 2019-11-10 NOTE — Progress Notes (Signed)
Pt ambulating halls frequently, pain is well controlled, no N/V. Education/discharge packet reviewed with  complete. Pt ready for d/c home with Tresa Garter.

## 2019-11-10 NOTE — Progress Notes (Signed)
Patient alert and oriented, Post op day 1.  Provided support and encouragement.  Encouraged pulmonary toilet, ambulation and small sips of liquids.  All questions answered.  Completed 12 ounces of bari clear fluid and 4 ounces of protein.  Will continue to monitor.

## 2019-11-16 ENCOUNTER — Telehealth (HOSPITAL_COMMUNITY): Payer: Self-pay

## 2019-11-16 NOTE — Telephone Encounter (Signed)
Patient called to discuss post bariatric surgery follow up questions.  See below:   1.  Tell me about your pain and pain management?denies  2.  Let's talk about fluid intake.  How much total fluid are you taking in?70 ounces of fluid  3.  How much protein have you taken in the last 2 days?60 grams of protein  4.  Have you had nausea?  Tell me about when have experienced nausea and what you did to help?denies  5.  Has the frequency or color changed with your urine?urine light in color  6.  Tell me what your incisions look like?denies any issues, small blister on side of one incision no problem  7.  Have you been passing gas? BM?had BM Thursday after surgery, Saturday and Monday  8.  If a problem or question were to arise who would you call?  Do you know contact numbers for [facility], [facility], and [facility]?aware of how to contact all services  9.  How has the walking going?walking around home  10.  How are your vitamins and calcium going?  How are you taking them?mvi and calcium started day after discharge

## 2019-11-24 ENCOUNTER — Other Ambulatory Visit: Payer: Self-pay

## 2019-11-24 ENCOUNTER — Encounter: Payer: No Typology Code available for payment source | Attending: General Surgery | Admitting: Skilled Nursing Facility1

## 2019-11-24 DIAGNOSIS — E669 Obesity, unspecified: Secondary | ICD-10-CM | POA: Insufficient documentation

## 2019-11-25 NOTE — Progress Notes (Signed)
2 Week Post-Operative Nutrition Class   Patient was seen on 09/09/18 for Post-Operative Nutrition education at the [facility].    Surgery date: 11/09/2019 Surgery type: RYGB Start weight at [facility]: 302.5 Weight today: 289.2   Body Composition Scale 11/25/2019  Total Body Fat % 49.9  Visceral Fat 22  Fat-Free Mass % 50   Total Body Water % 39.5   Muscle-Mass lbs 30.2  Body Fat Displacement          Torso  lbs 89.7         Left Leg  lbs 17.9         Right Leg  lbs 17.9         Left Arm  lbs 8.9         Right Arm   lbs 8.9     The following the learning objectives were met by the patient during this course:  Identifies Phase 3 (Soft, High Proteins) Dietary Goals and will begin from 2 weeks post-operatively to 2 months post-operatively  Identifies appropriate sources of fluids and proteins   States protein recommendations and appropriate sources post-operatively  Identifies the need for appropriate texture modifications, mastication, and bite sizes when consuming solids  Identifies appropriate multivitamin and calcium sources post-operatively  Describes the need for physical activity post-operatively and will follow MD recommendations  States when to call healthcare provider regarding medication questions or post-operative complications   Handouts given during class include:  Phase 3A: Soft, High Protein Diet Handout   Follow-Up Plan: Patient will follow-up at [facility] in 6 weeks for 2 month post-op nutrition visit for diet advancement per MD.

## 2019-11-30 ENCOUNTER — Telehealth: Payer: Self-pay | Admitting: Skilled Nursing Facility1

## 2019-11-30 NOTE — Telephone Encounter (Signed)
RD called pt to verify fluid intake once starting soft, solid proteins 2 week post-bariatric surgery.   Daily Fluid intake: 64+  Daily Protein intake: 60+  Concerns/issues:   No concerns  

## 2019-12-17 ENCOUNTER — Encounter: Payer: Self-pay | Admitting: Family Medicine

## 2019-12-17 ENCOUNTER — Telehealth (INDEPENDENT_AMBULATORY_CARE_PROVIDER_SITE_OTHER): Payer: No Typology Code available for payment source | Admitting: Family Medicine

## 2019-12-17 DIAGNOSIS — S40861A Insect bite (nonvenomous) of right upper arm, initial encounter: Secondary | ICD-10-CM

## 2019-12-17 DIAGNOSIS — L989 Disorder of the skin and subcutaneous tissue, unspecified: Secondary | ICD-10-CM

## 2019-12-17 DIAGNOSIS — W57XXXA Bitten or stung by nonvenomous insect and other nonvenomous arthropods, initial encounter: Secondary | ICD-10-CM | POA: Diagnosis not present

## 2019-12-17 NOTE — Progress Notes (Signed)
Virtual Visit via Video Note  I connected with Stephanie Herrera  on 12/17/19 at 10:20 AM EDT by a video enabled telemedicine application and verified that I am speaking with the correct person using two identifiers.  Location patient: home, [place] Location provider:work or home office Persons participating in the virtual visit: patient, provider, friend  I discussed the limitations of evaluation and management by telemedicine and the availability of in person appointments. The patient expressed understanding and agreed to proceed.   HPI:  Acute visit for a tick bite: -found a tick on her R arm near her arm pit 2 days ago -removed tick completely and cleaned with Hibiclens and put neopsporin on it -now with an itch bump -she did not save the tick and was not sure what kind of tick it was was -not sure how long it was on her -denies fevers, malaise, rashes, chills, body aches, flu like symptoms or any othe rsymptoms  ROS: See pertinent positives and negatives per HPI.  Past Medical History:  Diagnosis Date  . 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 Avitaminosis D 03/02/2014  . GERD (gastroesophageal reflux disease)   . History of kidney stones   . HLD (hyperlipidemia) 06/18/2012  . Migraine   . Painful orthopaedic hardware ([place]) removal, left foot   . Sleep apnea    cpap    Past Surgical History:  Procedure Laterality Date  . FOOT SURGERY Left 11/2013   Dr Doran Durand, tendon repair and lowered arch  ankle involved also  . GASTRIC ROUX-EN-Y  11/09/2019   Procedure: LAPAROSCOPIC ROUX-EN-Y GASTRIC BYPASS WITH UPPER ENDOSCOPY;  Surgeon: Greer Pickerel, MD;  Location: [facility];  Service: General;;  . HARDWARE REMOVAL Left 05/19/2015   Procedure: LEFT FOOT REMOVAL DEEP IMPLANT HARDWARE FROM CALCANEOUS AND FIRST METATARSAL;  Surgeon: Wylene Simmer, MD;  Location: [place];  Service: Orthopedics;  Laterality: Left;  . KNEE ARTHROSCOPY Right   . TONSILLECTOMY     and adneoids     Family History  Problem Relation Age of Onset  . Hyperlipidemia Mother   . Lung cancer Father   . Hemophilia Father   . HIV Father        from transfusion    SOCIAL HX: see hpi   Current Outpatient Medications:  .  CALCIUM PO, Take 500 mg by mouth in the morning and at bedtime., Disp: , Rfl:  .  Multiple Vitamins-Minerals (MULTIVITAMIN WITH MINERALS) tablet, Take 1 tablet by mouth daily. chewable, Disp: , Rfl:   EXAM:  VITALS per patient if applicable: denies fevers  GENERAL: alert, oriented, appears well and in no acute distress  HEENT: atraumatic, conjunttiva clear, no obvious abnormalities on inspection of external nose and ears  NECK: normal movements of the head and neck  LUNGS: on inspection no signs of respiratory distress, breathing rate appears normal, no obvious gross SOB, gasping or wheezing  CV: no obvious cyanosis  SKIN: small, mildly erythematous papule R axillary region  MS: moves all visible extremities without noticeable abnormality  PSYCH/NEURO: pleasant and cooperative, no obvious depression or anxiety, speech and thought processing grossly intact  ASSESSMENT AND PLAN:  Discussed the following assessment and plan:  Skin lesion  Insect bite of upper arm with local reaction, right, initial encounter  Tick bite, initial encounter  -we discussed possible serious and likely etiologies, options for evaluation and workup, limitations of telemedicine visit vs in person visit, treatment, treatment risks and precautions. Pt prefers to treat via telemedicine empirically  rather then risking or undertaking an in person visit at this moment. Suspect small local reaction for current symptoms. Can use topical benadryl for itch. Discussed prevention, tick borne illnesses, signs and symptoms to look out for, treatment, etc.Patient agrees to seek prompt in person care if worsening, new symptoms arise, or if is not improving with treatment.   I discussed the  assessment and treatment plan with the patient. The patient was provided an opportunity to ask questions and all were answered. The patient agreed with the plan and demonstrated an understanding of the instructions.   The patient was advised to call back or seek an in-person evaluation if the symptoms worsen or if the condition fails to improve as anticipated.   Terressa Koyanagi, DO

## 2019-12-19 ENCOUNTER — Encounter: Payer: Self-pay | Admitting: Physician Assistant

## 2019-12-22 ENCOUNTER — Telehealth: Payer: Self-pay | Admitting: Physician Assistant

## 2019-12-22 ENCOUNTER — Encounter: Payer: Self-pay | Admitting: Physician Assistant

## 2019-12-22 ENCOUNTER — Telehealth (INDEPENDENT_AMBULATORY_CARE_PROVIDER_SITE_OTHER): Payer: No Typology Code available for payment source | Admitting: Physician Assistant

## 2019-12-22 VITALS — Ht 63.0 in | Wt 289.0 lb

## 2019-12-22 DIAGNOSIS — L03011 Cellulitis of right finger: Secondary | ICD-10-CM | POA: Diagnosis not present

## 2019-12-22 MED ORDER — DOXYCYCLINE HYCLATE 100 MG PO TABS
100.0000 mg | ORAL_TABLET | Freq: Two times a day (BID) | ORAL | 0 refills | Status: DC
Start: 2019-12-22 — End: 2023-02-20

## 2019-12-22 NOTE — Progress Notes (Signed)
° °  TELEPHONE ENCOUNTER   Patient verbally agreed to telephone visit and is aware that copayment and coinsurance may apply. Patient was treated using telemedicine according to accepted telemedicine protocols.  Location of the patient: home Location of provider: [place] [address] [employer] of all persons participating in the telemedicine service and role in the encounter: Jarold Motto, [place] , [place]  Subjective:   Chief Complaint  Patient presents with   Hand Pain    Finger pain due to hangnail, soaking in alcohol and peroxide. Red and hot to the touch. She states that she has a yellow streak going through her nail.      HPI   Chief Complaint  Patient presents with   Hand Pain    Finger pain due to hangnail, soaking in alcohol and peroxide. Red and hot to the touch. She states that she has a yellow streak going through her nail.    Patient is for a virtual visit to discuss the above.   Has a history of this happening q 4-5 years, always in the R middle finger. Denies fevers, chills, nausea, vomiting, malaise. Has not tried top open the area. Has difficulty typing due to this.  Has required antibiotics in the past for this and she feels like she is due for antibiotics at this point.  Patient Active Problem List   Diagnosis Date Noted   GERD (gastroesophageal reflux disease) 11/09/2019   S/P gastric bypass 11/09/2019   Bilateral edema of lower extremity 03/16/2019   OSA on CPAP 06/15/2016   IUD (intrauterine device) in place 03/16/2016   Avitaminosis D 03/02/2014   Obesity, morbid, BMI 50 or higher (HCC) 05/18/2013   HLD (hyperlipidemia) 06/18/2012   History of migraine headaches 06/17/2012   Social History   Tobacco Use   Smoking status: Never Smoker   Smokeless tobacco: Never Used  Substance Use Topics   Alcohol use: Yes    Alcohol/week: 1.0 standard drinks    Types: 1 Glasses of wine per week    Comment: social    Current Outpatient  Medications:    CALCIUM PO, Take 500 mg by mouth in the morning and at bedtime., Disp: , Rfl:    Multiple Vitamins-Minerals (MULTIVITAMIN WITH MINERALS) tablet, Take 1 tablet by mouth daily. chewable, Disp: , Rfl:    triamcinolone cream (KENALOG) 0.1 %, , Disp: , Rfl:  Allergies  Allergen Reactions   Hydrocodone Itching    She is able to tolerate hydromorphone    Iodinated Diagnostic Agents Hives, Shortness Of Breath and Swelling   Simvastatin     Jaundice    Assessment & Plan:   1. Paronychia of finger of right hand   No red flags on discussion. Will trial oral doxycyline to help aid in healing. Follow-up in office if worsening or lack of improvement.  No orders of the defined types were placed in this encounter.  No orders of the defined types were placed in this encounter.   Jarold Motto, [place] 12/22/2019  Time spent with the patient: 6 minutes, spent in obtaining information about her symptoms, reviewing her previous labs, evaluations, and treatments, counseling her about her condition (please see the discussed topics above), and developing a plan to further investigate it; she had a number of questions which I addressed.

## 2019-12-22 NOTE — Telephone Encounter (Signed)
Patient states she went to pharmacy to pick up the anabiotics and pharmacy states that they haven't received anything.

## 2019-12-22 NOTE — Addendum Note (Signed)
Addended by: Haynes Bast on: 12/22/2019 08:03 PM   Modules accepted: Orders

## 2019-12-23 MED ORDER — DOXYCYCLINE HYCLATE 100 MG PO TABS
100.0000 mg | ORAL_TABLET | Freq: Two times a day (BID) | ORAL | 0 refills | Status: DC
Start: 2019-12-23 — End: 2023-02-20

## 2019-12-23 NOTE — Telephone Encounter (Signed)
New script has been sent to the patient's pharmacy  

## 2020-01-07 ENCOUNTER — Ambulatory Visit: Payer: 59 | Admitting: Skilled Nursing Facility1

## 2020-01-25 ENCOUNTER — Encounter: Payer: No Typology Code available for payment source | Attending: General Surgery | Admitting: Skilled Nursing Facility1

## 2020-01-25 ENCOUNTER — Other Ambulatory Visit: Payer: Self-pay

## 2020-01-25 DIAGNOSIS — E669 Obesity, unspecified: Secondary | ICD-10-CM | POA: Diagnosis present

## 2020-01-25 NOTE — Progress Notes (Signed)
Bariatric Nutrition Follow-Up Visit Medical Nutrition Therapy   Pt given star previously: No  NUTRITION ASSESSMENT    Anthropometrics  Surgery date: 11/09/2019 Surgery type: RYGB Start weight at [facility]: 302.5 Weight today: 262   Body Composition Scale 11/25/2019 01/25/2020  Total Body Fat % 49.9 47.2  Visceral Fat 22 18  Fat-Free Mass % 50 52.7   Total Body Water % 39.5 40.8   Muscle-Mass lbs 30.2 30.8  Body Fat Displacement           Torso  lbs 89.7 76.8         Left Leg  lbs 17.9 15.3         Right Leg  lbs 17.9 15.3         Left Arm  lbs 8.9 7.6         Right Arm   lbs 8.9 7.6    Clinical  Medical hx: GERD Medications:  Labs:    Lifestyle & Dietary Hx  Pt states she has trouble getting in enough protein due to not tolerating meats well. Pt states her meats have to be prepared a certain way to tolerate them. Pt states he does have many days in the week she goes 5+ hours without eating or drinking due to getting busy with work. Pt states she has started pre-planning meals. Pt state she has been eating vegetables. Pt states she will be starting with a  trainer soon.   Estimated daily fluid intake: 60 oz Estimated daily protein intake: 30-40 g Supplements: multi and calcium  Current average weekly physical activity: walking 2 miles   24-Hr Dietary Recall First Meal: protein shake Snack:  Second Meal: lunch meat Snack:  Third Meal: greek yogurt + whipped cream cheese + peanut butter powder Snack:  Beverages: water, gatorade zero  Post-Op Goals/ Signs/ Symptoms Using straws: no Drinking while eating: no Chewing/swallowing difficulties: no Changes in vision: no Changes to mood/headaches: n Hair loss/changes to skin/nails: no Difficulty focusing/concentrating: no Sweating: no Dizziness/lightheadedness: no Palpitations: no  Carbonated/caffeinated beverages: no N/V/D/C/Gas: no Abdominal pain: no Dumping syndrome: no    NUTRITION DIAGNOSIS   Overweight/obesity ([place]-3.3) related to past poor dietary habits and physical inactivity as evidenced by completed bariatric surgery and following dietary guidelines for continued weight loss and healthy nutrition status.     NUTRITION INTERVENTION Nutrition counseling (C-1) and education (E-2) to facilitate bariatric surgery goals, including: . Diet advancement to the next phase (phase 4) now including non starchy vegetables  . The importance of consuming adequate calories as well as certain nutrients daily due to the body's need for essential vitamins, minerals, and fats . The importance of daily physical activity and to reach a goal of at least 150 minutes of moderate to vigorous physical activity weekly (or as directed by their physician) due to benefits such as increased musculature and improved lab values . The importance of intuitive eating specifically learning hunger-satiety cues and understanding the importance of learning a new body  Goals:  Make eating and drinking a priority  Aim to eat every 3 hours -Continue to aim for a minimum of 64 fluid ounces 7 days a week with at least 30 ounces being plain water -Eat non-starchy vegetables 2 times a day 7 days a week -Start out with soft cooked vegetables today and tomorrow; if tolerated begin to eat raw vegetables or cooked including salads -Eat your 3 ounces of protein first then start in on your non-starchy vegetables; once you understand how much  of your meal leads to satisfaction and not full while still eating 3 ounces of protein and non-starchy vegetables you can eat them in any order  -Continue to aim for 30 minutes of activity at least 5 times a week -Do NOT cook with/add to your food: alfredo sauce, cheese sauce, barbeque sauce, ketchup, fat back, butter, bacon grease, grease, Crisco, OR SUGAR   Handouts Provided Include   Phase 4  Learning Style & Readiness for Change Teaching method utilized: Visual & Auditory   Demonstrated degree of understanding via: Teach Back  Barriers to learning/adherence to lifestyle change: learning curve   RD's Notes for Next Visit . Assess adherence to pt chosen goals   MONITORING & EVALUATION Dietary intake, weekly physical activity, body weight  Next Steps Patient is to follow-up in 3 months

## 2020-09-07 ENCOUNTER — Encounter (HOSPITAL_COMMUNITY): Payer: Self-pay | Admitting: *Deleted

## 2021-06-01 ENCOUNTER — Encounter (HOSPITAL_COMMUNITY): Payer: Self-pay | Admitting: *Deleted

## 2021-08-25 IMAGING — RF DG UGI W SINGLE CM
8 of 10 series · 16 of 24 positions shown · non-contrast
Comparison: Report from radiographs of the abdomen 09/14/2002
(images unavailable).

CLINICAL DATA: At risk for sleep apnea. Patient reports
preoperative for Roux EN Y gastric bypass surgery.

EXAM:
UPPER GI SERIES WITH KUB
TECHNIQUE: After obtaining a scout radiograph a routine upper GI series was
performed using thin barium.
FLUOROSCOPY TIME:  Fluoroscopy Time:  1.6 minute
Radiation Exposure Index (if provided by the fluoroscopic device):
91.0 mGy
Number of Acquired Spot Images: None

[Series 1: t abdomen supine · 0.15mm/px · 1 of 1 slices shown]
[im 1/1]
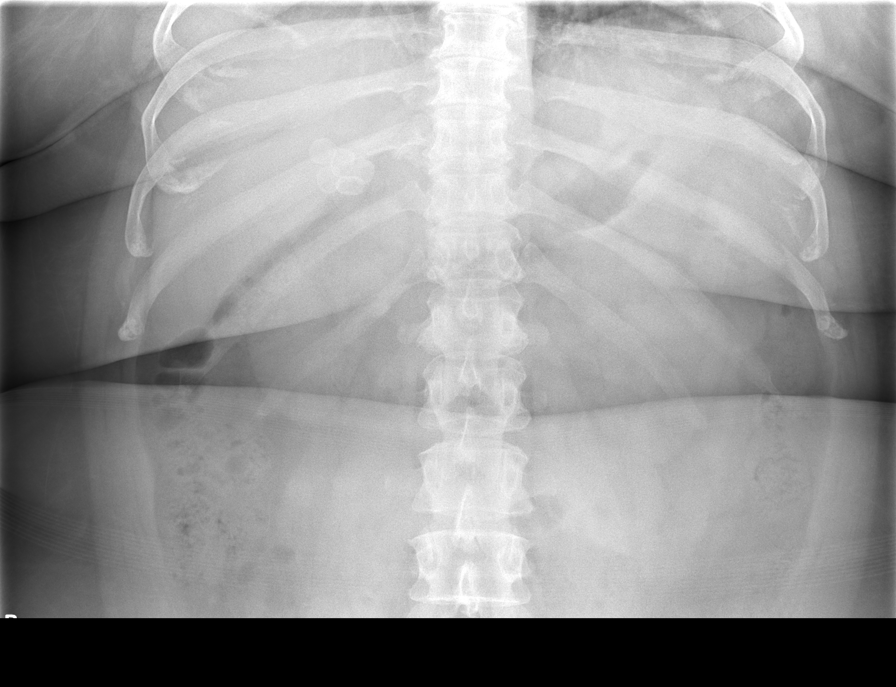

[Series 3: cp_standard · 0.34mm/px · 2 of 285 frames shown (1 of 7)]
[frame 43/285]
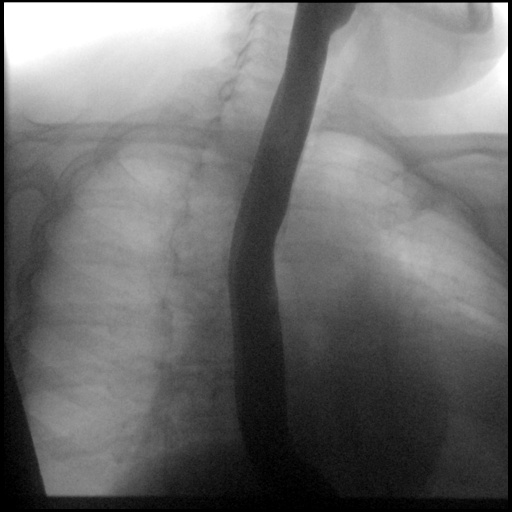
[frame 203/285]
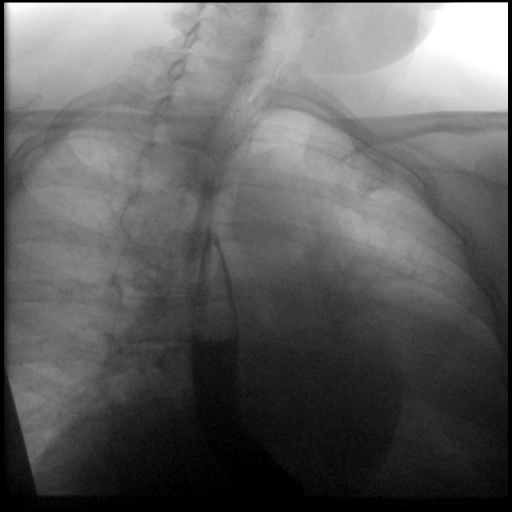

[Series 4: cp_standard · 0.34mm/px · 2 of 191 frames shown (2 of 7)]
[frame 1/191]
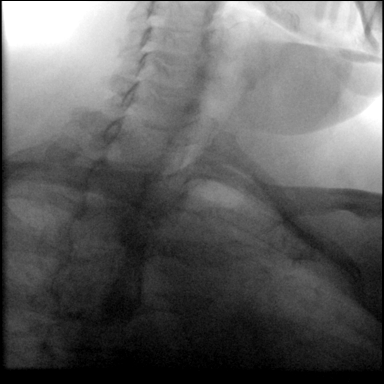
[frame 29/191]
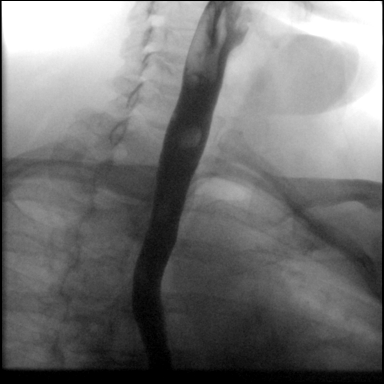

[Series 5: cp_standard · 0.34mm/px · 3 of 12 frames shown (3 of 7)]
[frame 2/12]
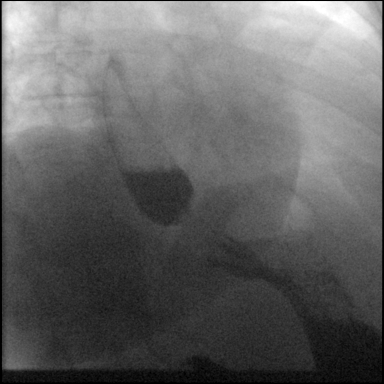
[frame 7/12]
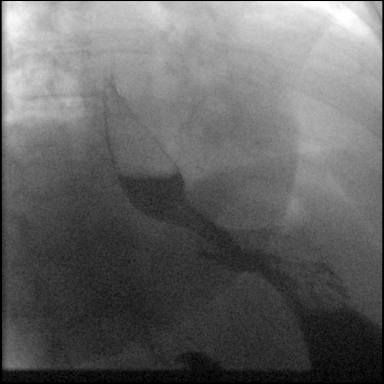
[frame 12/12]
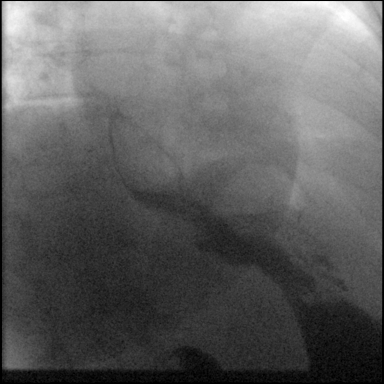

[Series 7: cp_standard · 0.35mm/px · 3 of 126 frames shown (4 of 7)]
[frame 4/126]
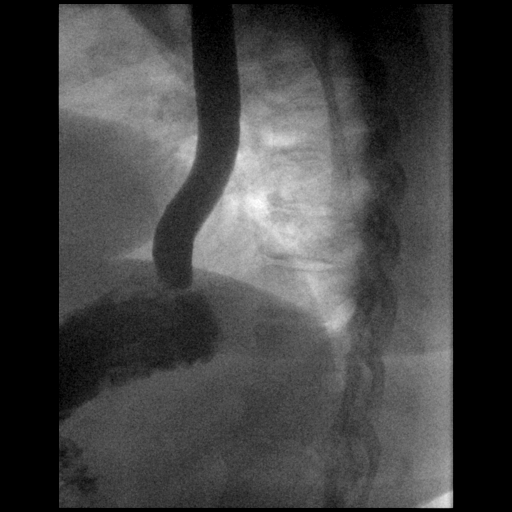
[frame 64/126]
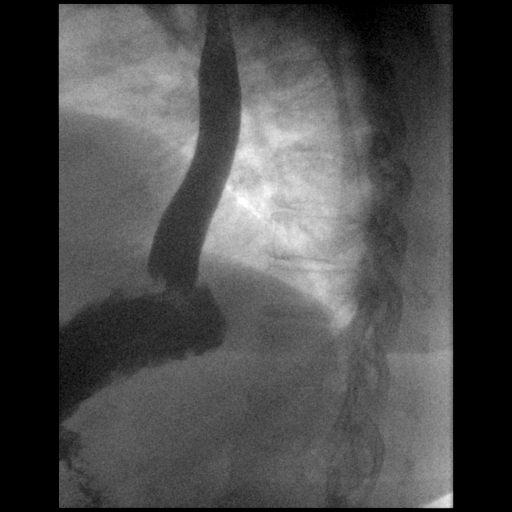
[frame 108/126]
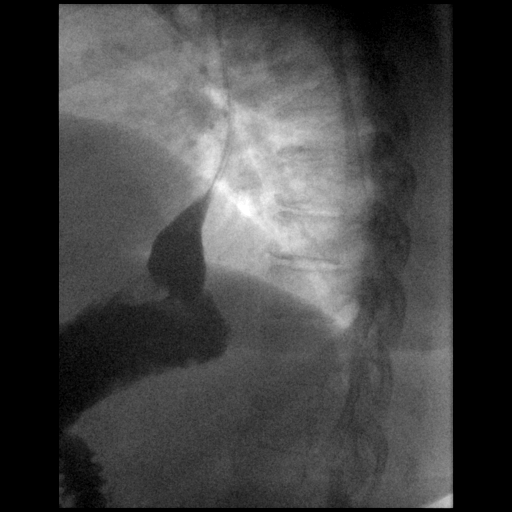

[Series 8: cp_standard · 0.53mm/px · 2 of 11 frames shown (5 of 7)]
[frame 10/11]
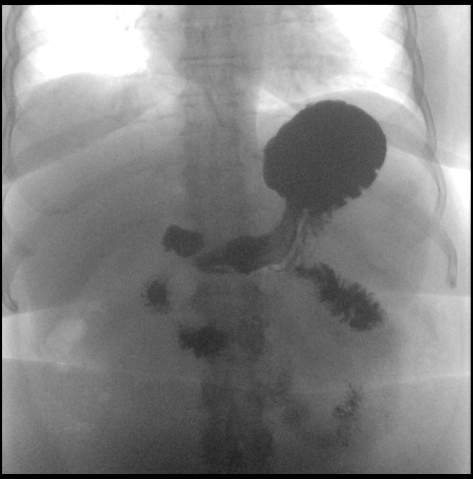
[frame 11/11]
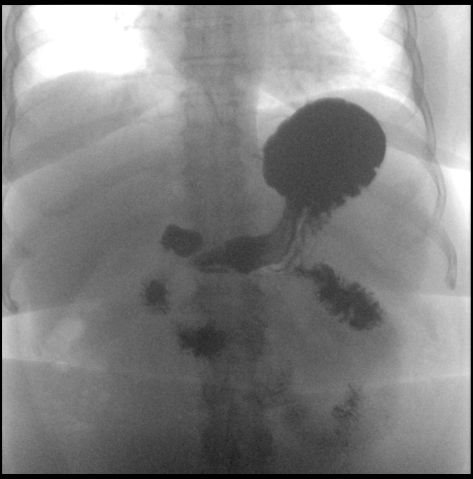

[Series 10: cp_standard · 0.18mm/px · 1 of 1 slices shown (6 of 7)]
[im 1/1]
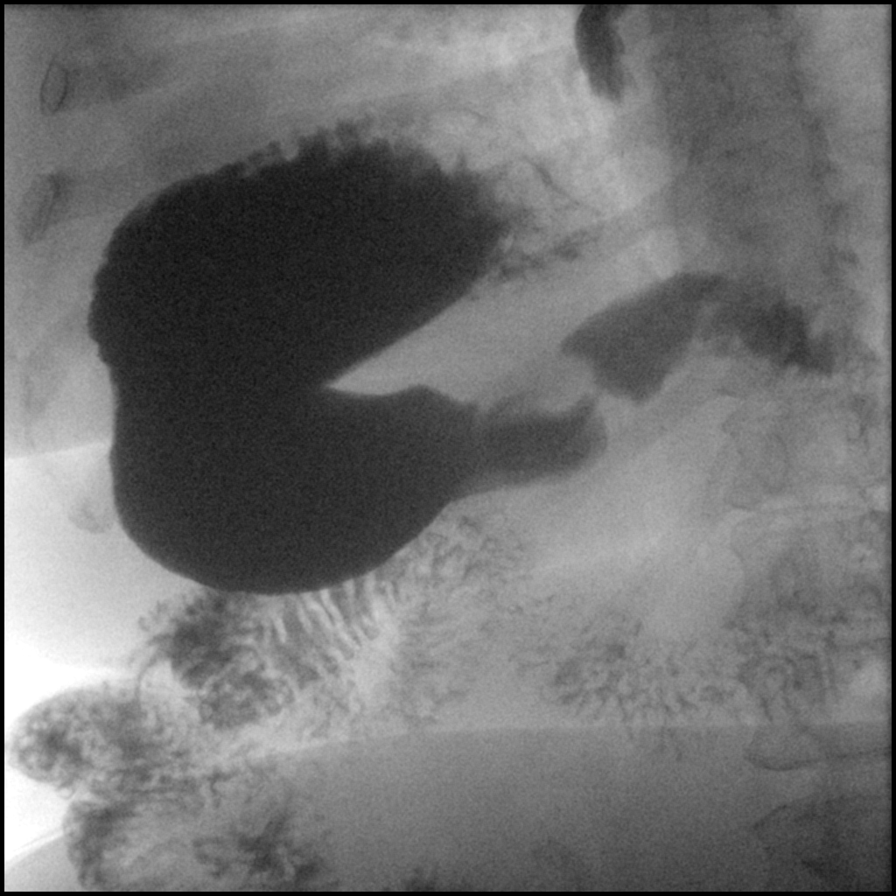

[Series 11: cp_standard · 0.36mm/px · 2 of 161 frames shown (7 of 7)]
[frame 81/161]
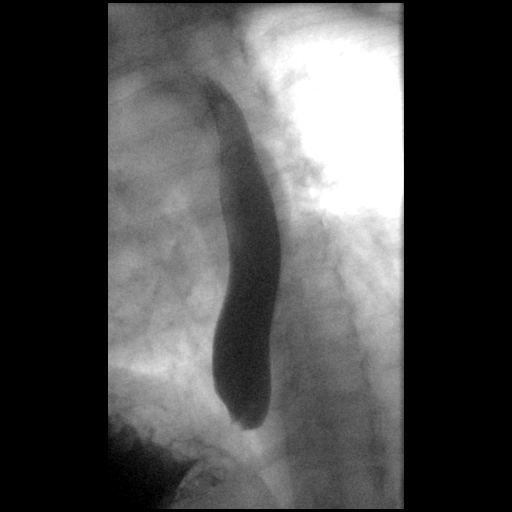
[frame 161/161]
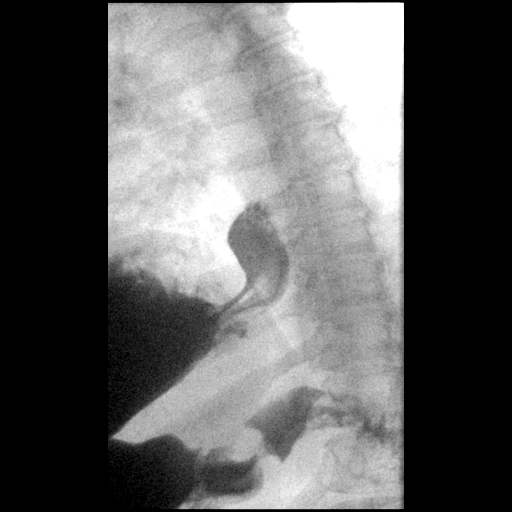

[16 of 24 positions shown; findings below may reference images not displayed]

FINDINGS: A scout radiograph demonstrates gallstones within the right upper
quadrant. No dilated loops of bowel are demonstrated to suggest
obstruction. No acute bony abnormality. Multiple pelvic phleboliths.

Fluoroscopic evaluation demonstrates normal caliber and smooth
contour of the esophagus. No evidence of fixed stricture, mass or
mucosal abnormality. Normal esophageal motility was observed. No
hiatal hernia. No gastroesophageal reflux was observed.

Normal appearance of the stomach, duodenal bulb and duodenal sweep.
No evidence of ulceration, fold thickening or mass.
IMPRESSION: Unremarkable upper GI series. Normal anatomy with normal course of
the duodenal sweep.

Gallstones.

## 2021-08-25 IMAGING — DX DG CHEST 2V
2 series · 2 of 2 positions shown · non-contrast
Comparison: None

CLINICAL DATA: Preoperative evaluation for bariatric surgery

EXAM:
CHEST - 2 VIEW

[chest pa]
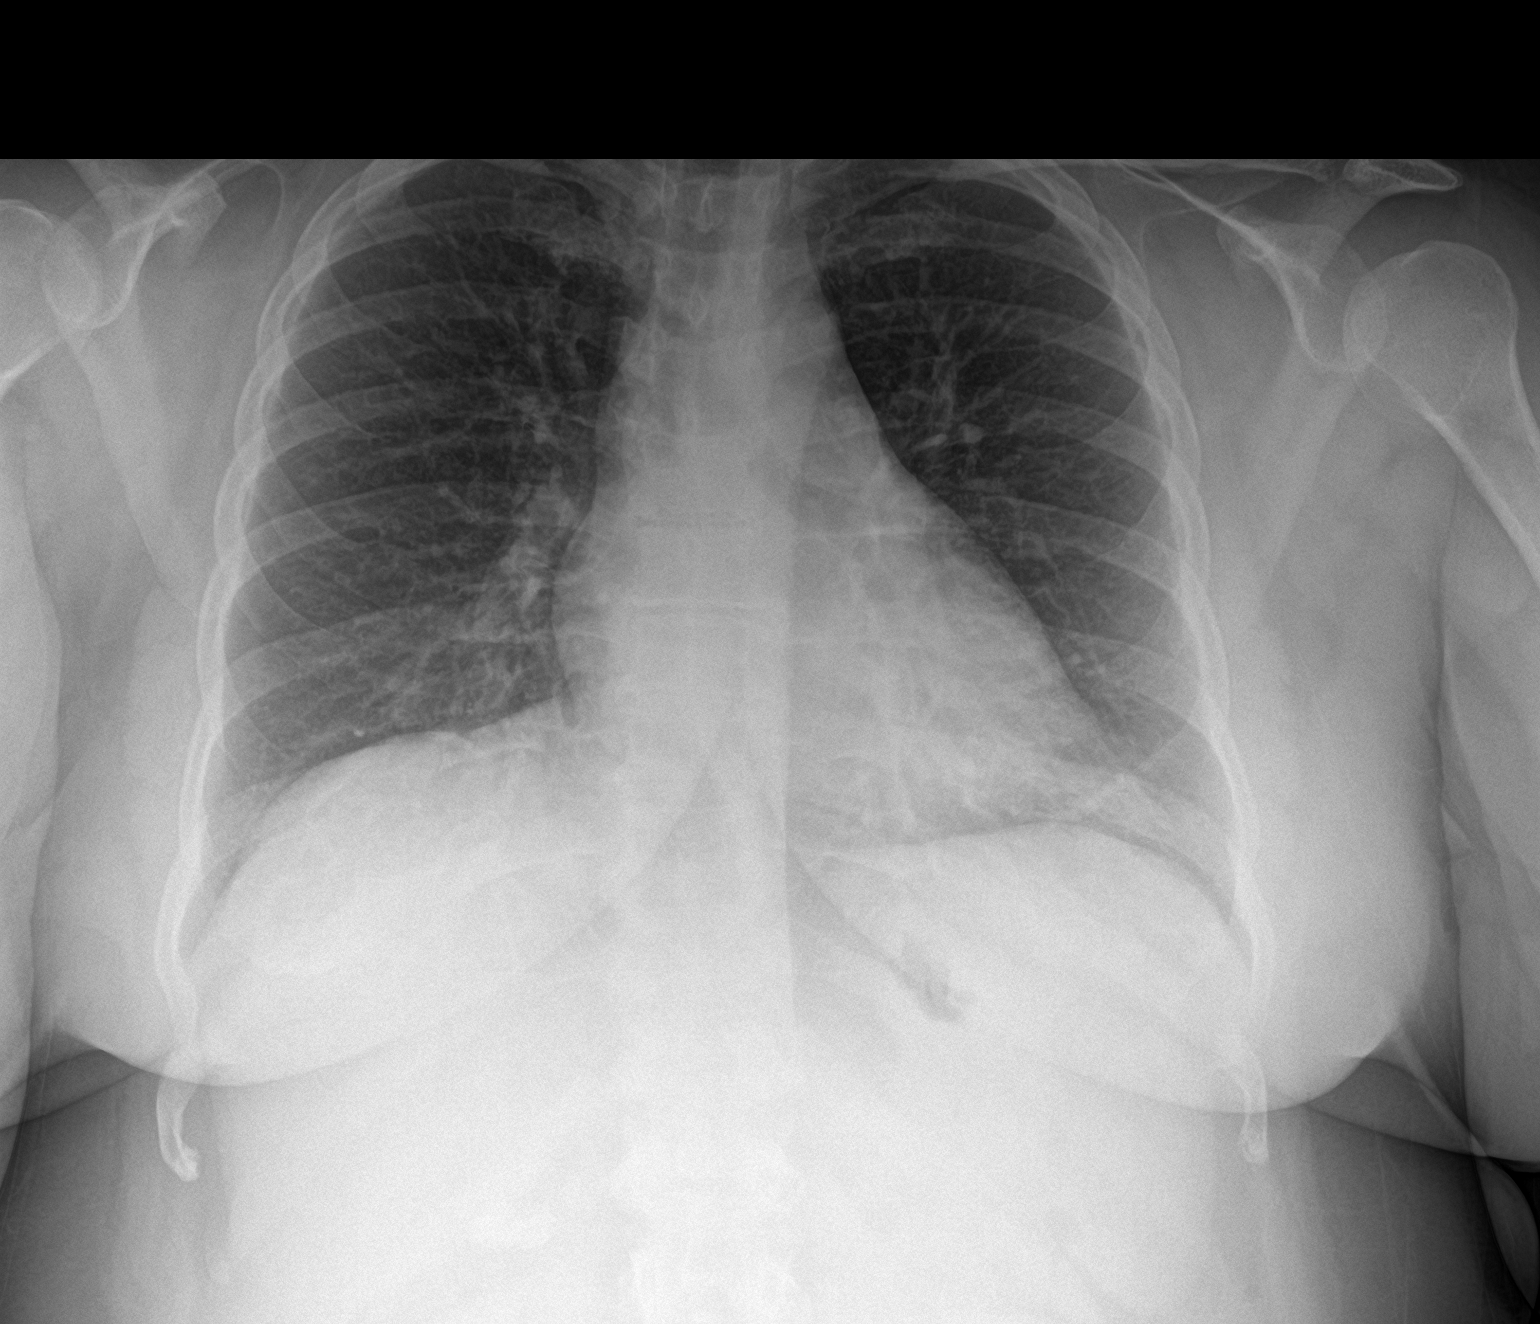

[chest lat]
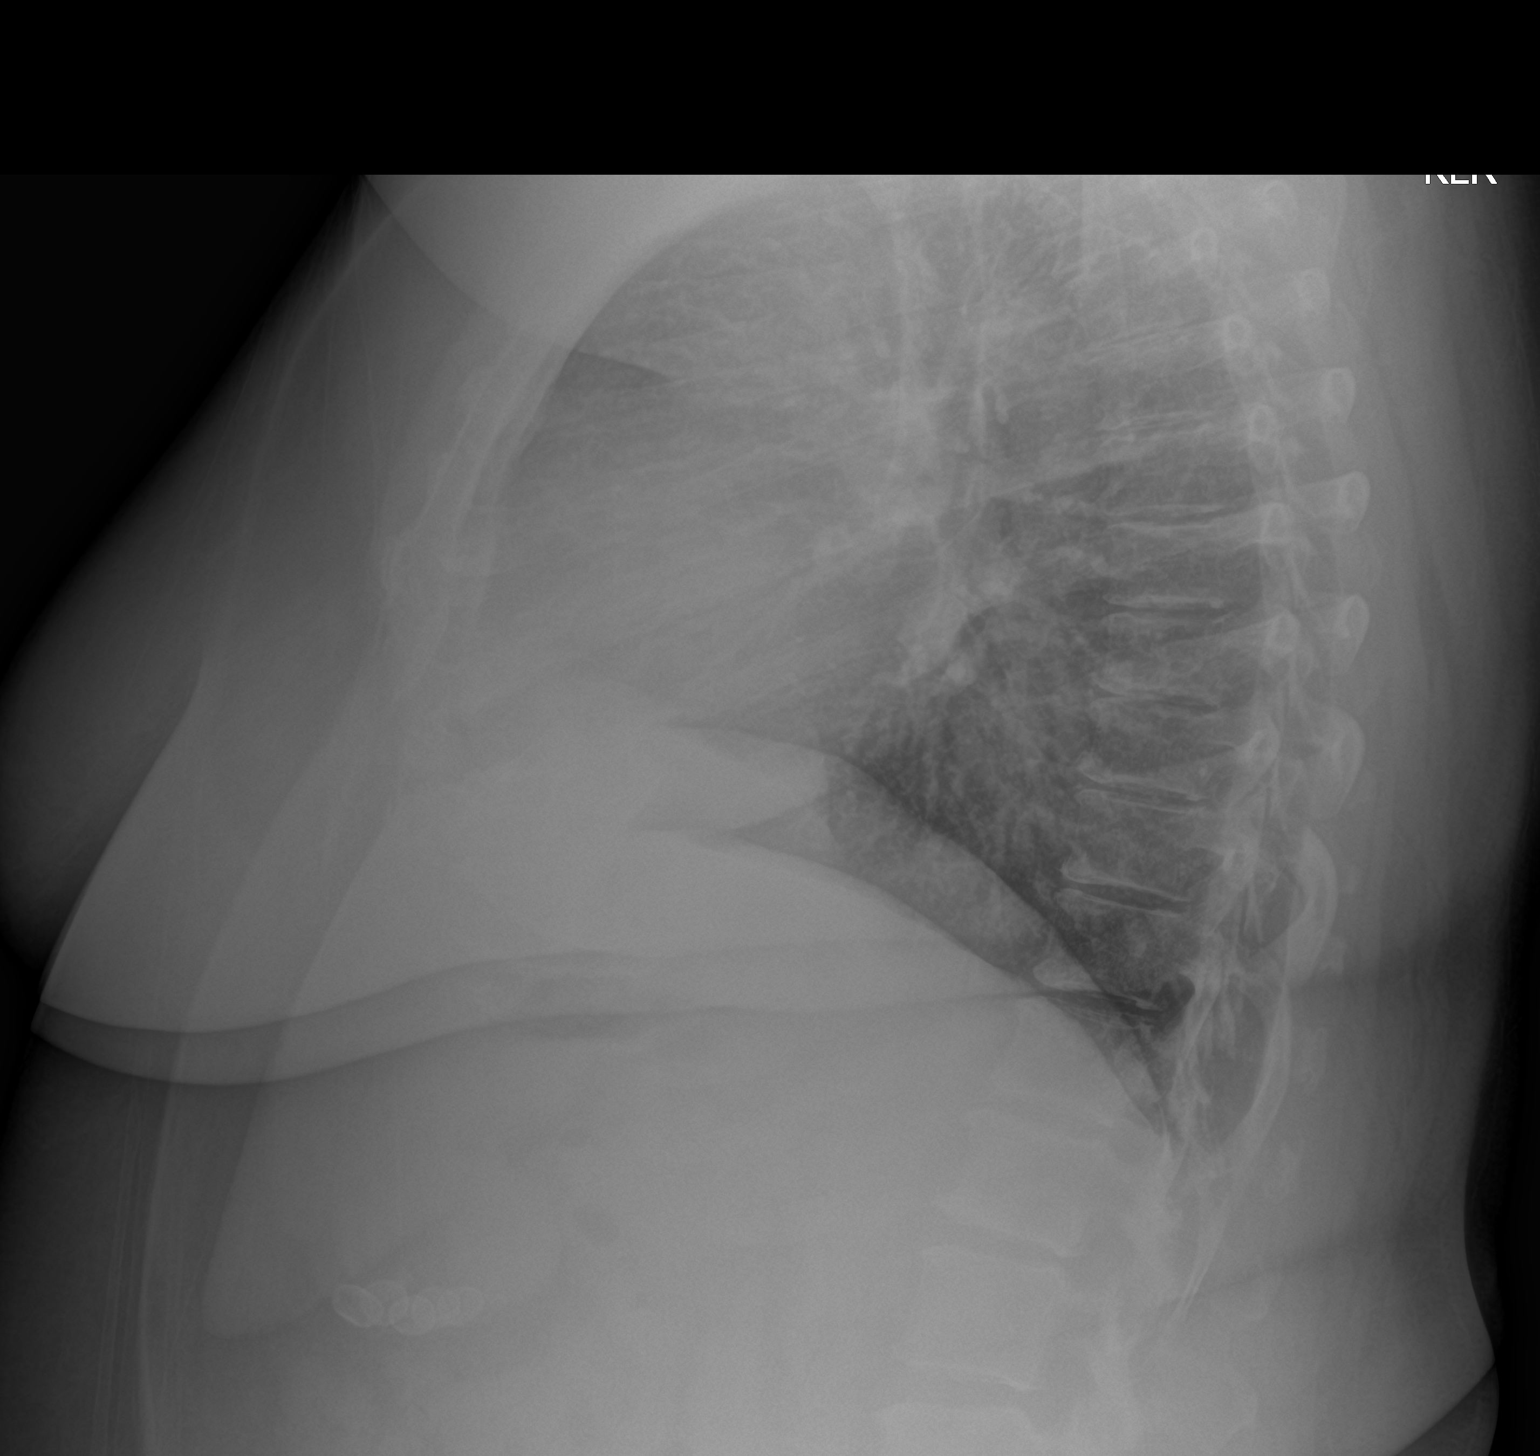

[2 of 2 positions shown; findings below may reference images not displayed]

FINDINGS: Upper normal heart size.

Mediastinal contours and pulmonary vascularity normal.

Lungs clear.

No infiltrate, pleural effusion, or pneumothorax.

Osseous structures unremarkable.
IMPRESSION: No acute abnormalities.

## 2022-04-09 ENCOUNTER — Encounter: Payer: Self-pay | Admitting: *Deleted

## 2022-06-15 ENCOUNTER — Encounter (HOSPITAL_COMMUNITY): Payer: Self-pay | Admitting: *Deleted

## 2022-06-28 ENCOUNTER — Encounter: Payer: Self-pay | Admitting: *Deleted

## 2023-02-20 ENCOUNTER — Encounter: Payer: Self-pay | Admitting: *Deleted

## 2023-02-20 ENCOUNTER — Ambulatory Visit: Payer: No Typology Code available for payment source | Admitting: Neurology

## 2023-02-20 ENCOUNTER — Telehealth: Payer: Self-pay | Admitting: Neurology

## 2023-02-20 ENCOUNTER — Encounter: Payer: Self-pay | Admitting: Neurology

## 2023-02-20 VITALS — BP 149/81 | HR 66 | Ht 62.5 in | Wt 227.0 lb

## 2023-02-20 DIAGNOSIS — H538 Other visual disturbances: Secondary | ICD-10-CM | POA: Diagnosis not present

## 2023-02-20 DIAGNOSIS — G4733 Obstructive sleep apnea (adult) (pediatric): Secondary | ICD-10-CM | POA: Diagnosis not present

## 2023-02-20 DIAGNOSIS — Z9884 Bariatric surgery status: Secondary | ICD-10-CM | POA: Diagnosis not present

## 2023-02-20 DIAGNOSIS — R930 Abnormal findings on diagnostic imaging of skull and head, not elsewhere classified: Secondary | ICD-10-CM

## 2023-02-20 DIAGNOSIS — R519 Headache, unspecified: Secondary | ICD-10-CM

## 2023-02-20 DIAGNOSIS — G8929 Other chronic pain: Secondary | ICD-10-CM

## 2023-02-20 DIAGNOSIS — H93A2 Pulsatile tinnitus, left ear: Secondary | ICD-10-CM

## 2023-02-20 DIAGNOSIS — G4489 Other headache syndrome: Secondary | ICD-10-CM

## 2023-02-20 DIAGNOSIS — H9202 Otalgia, left ear: Secondary | ICD-10-CM
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
## 2023-02-20 NOTE — Telephone Encounter (Signed)
sent to [facility] they obtain [employer] auth [id number] 

## 2023-02-20 NOTE — Patient Instructions (Signed)
You may have a condition called pseudotumor cerebri, which means that there increased fluid pressure around your brain and results in pressure on your brain, which can cause headache, and pressure on the eye nerve(s), which can cause blurry vision, even loss of vision.  Excess fluid pressure on the brain can also cause a whooshing sound in the ear or ears. I would like to suggest a few things today:  1. We will do a brain MRI with and without contrast if possible as long as you do not have any contrast allergy.  You do report a reaction in the 90s. 2. We will get a formal eye exam, please go back to your optometrist or ophthalmologist at [facility] for a full, dilated eye exam.  In particular, we want to know if there is any swelling on your eye nerves called papilledema.   3. We will request a LP (lumbar puncture/spinal tap) with pressure testing and routine fluid testing. We will call you with the results.  4.  If your spinal fluid pressure is indeed elevated, we will have you start a medication called diamox to help keep your spinal fluid pressure at bay.  Some people need more than 1 spinal tap over time. 5. As you know, your vision and visual field can be affected. The most serious complication of having pseudotumor cerebri is loss of vision, which can be permanent. Work up, at least initially with the eye specialist should include: best corrected visual acuity, formal visual field testing, dilated fundus examination with optic disc photographs, and often optical coherence tomography (OCT) of the optic nerve, retinal nerve fiber layer, and macular ganglion cell layer. Worsening vision is an indication for intensifying treatment.  6.  Ultimately, you may need to be considered for a shunt in the future (to prevent fluid pressure from building up over and over). 7. We will also do a repeat home sleep test to look at your sleep apnea as it was moderate in the past before your weight loss.  You may need to  go back on your AutoPap machine.  Treatment of obstructive sleep apnea can help headaches, also help with metabolism and weight loss, and ultimately, treatment of sleep apnea can reduce risk for cardiovascular complications including heart disease and stroke risk. 8.  We will do some blood work today.

## 2023-03-04 ENCOUNTER — Telehealth: Payer: Self-pay

## 2023-03-04 NOTE — Telephone Encounter (Signed)
Contacted pt,  informed her vitamin D level below normal at 23, low normal value is usually 30. MD recommend starting an over-the-counter vitamin D supplement, about 500 units daily but also follow-up with primary care for a recheck in about 3 months. 2. Her rheumatoid factor was slightly elevated, this can be seen as a nonspecific finding but can also be positive in patients with autoimmune and rheumatological conditions, including rheumatoid arthritis.   4. Her blood sugar was mildly elevated. 5. ACTH which is a hormone excreted by the pituitary gland of the brain (we talked about this during the appointment), and is involved in cortisol production, was slightly below normal.   At this juncture, Dr Frances Furbish recommended follow-up with primary care to discuss these different findings, they can review and pull up our labs in the computer at the time. This is to make sure her vitamin D level gets rechecked in the near future.   She should also discuss with her primary care further evaluations possibly with with endocrinology for her hormones and rheumatology for the positive rheumatoid factor. Also informed her home sleep study device mailed to you this [facility].  She verbally understood results and was appreciative for the call.

## 2023-03-04 NOTE — Telephone Encounter (Signed)
-----   Message from Huston Foley sent at 02/26/2023 12:49 PM EDT ----- Labs mostly benign, except for a few changes:  1. vitamin D level below normal at 23, low normal value is usually 30.  I recommend starting an over-the-counter vitamin D supplement, about 500 units daily but also follow-up with primary care for a recheck in about 3 months. 2. Her rheumatoid factor was slightly elevated, this can be seen as a nonspecific finding but can also be positive in patients with autoimmune and rheumatological conditions, including rheumatoid arthritis.   4. Her blood sugar was mildly elevated.  5. ACTH which is a hormone excreted by the pituitary gland of the brain (we talked about this during the appointment), and is involved in cortisol production, was slightly below normal.   At this juncture, I recommended follow-up with primary care to discuss these different findings, they can review and pull up our labs in the computer at the time.  This is to make sure her vitamin D level gets rechecked in the near future.   She should also discuss with her primary care further evaluations possibly with with endocrinology for her hormones and rheumatology for the positive rheumatoid factor.

## 2023-03-06 ENCOUNTER — Ambulatory Visit: Payer: No Typology Code available for payment source | Admitting: Neurology

## 2023-03-06 DIAGNOSIS — G4733 Obstructive sleep apnea (adult) (pediatric): Secondary | ICD-10-CM | POA: Diagnosis not present

## 2023-03-06 DIAGNOSIS — H538 Other visual disturbances: Secondary | ICD-10-CM

## 2023-03-06 DIAGNOSIS — R930 Abnormal findings on diagnostic imaging of skull and head, not elsewhere classified: Secondary | ICD-10-CM

## 2023-03-06 DIAGNOSIS — Z9884 Bariatric surgery status: Secondary | ICD-10-CM

## 2023-03-06 DIAGNOSIS — H93A2 Pulsatile tinnitus, left ear: Secondary | ICD-10-CM

## 2023-03-06 DIAGNOSIS — R519 Headache, unspecified: Secondary | ICD-10-CM

## 2023-03-06 DIAGNOSIS — G4489 Other headache syndrome: Secondary | ICD-10-CM

## 2023-03-06 DIAGNOSIS — G8929 Other chronic pain: Secondary | ICD-10-CM

## 2023-03-13 ENCOUNTER — Encounter: Payer: Self-pay | Admitting: Neurology

## 2023-03-13 ENCOUNTER — Ambulatory Visit
Admission: RE | Admit: 2023-03-13 | Discharge: 2023-03-13 | Disposition: A | Discharge status: Home / Self Care | Payer: No Typology Code available for payment source | Source: Ambulatory Visit | Attending: Neurology | Admitting: Neurology

## 2023-03-13 DIAGNOSIS — G4733 Obstructive sleep apnea (adult) (pediatric): Secondary | ICD-10-CM

## 2023-03-13 DIAGNOSIS — R930 Abnormal findings on diagnostic imaging of skull and head, not elsewhere classified: Secondary | ICD-10-CM

## 2023-03-13 DIAGNOSIS — R519 Headache, unspecified: Secondary | ICD-10-CM

## 2023-03-13 DIAGNOSIS — G8929 Other chronic pain: Secondary | ICD-10-CM

## 2023-03-13 DIAGNOSIS — H93A2 Pulsatile tinnitus, left ear: Secondary | ICD-10-CM

## 2023-03-13 DIAGNOSIS — H538 Other visual disturbances: Secondary | ICD-10-CM

## 2023-03-13 DIAGNOSIS — G4489 Other headache syndrome: Secondary | ICD-10-CM

## 2023-03-13 DIAGNOSIS — Z9884 Bariatric surgery status: Secondary | ICD-10-CM

## 2023-03-13 MED ORDER — GADOPICLENOL 0.5 MMOL/ML IV SOLN
10.0000 mL | Freq: Once | INTRAVENOUS | Status: AC | PRN
  Administered 2023-03-13: 10 mL via INTRAVENOUS

## 2023-03-20 ENCOUNTER — Telehealth: Payer: Self-pay

## 2023-03-20 NOTE — Telephone Encounter (Signed)
LVM to patient about the sleep lab receiving the results of the mail out home sleep study. Report has been generated for the MD to read. Once it is, pt will receive a call to go over the results. Pt was also instructed to now throw away the HST device.

## 2023-03-21 NOTE — Procedures (Signed)
   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  Her home sleep test from November 2020 indicated moderate obstructive sleep apnea.  She has achieved a significant amount of weight loss since her bariatric surgery in 2021.    Epworth sleepiness score: 12/24.  BMI: 40.9 kg/m  FINDINGS:   Sleep Summary:   Total Recording Time (hours, min): 8 hours, 22 min  Total Sleep Time (hours, min):  7 hours, 57 min  Percent REM (%):    29%   Respiratory Indices:   Calculated pAHI (per hour):  7.8/hour         REM pAHI:    12.2/hour       NREM pAHI: 5.9/hour  Central pAHI: 0.5/hour  Oxygen Saturation Statistics:    Oxygen Saturation (%) Mean: 93%   Minimum oxygen saturation (%):                 86%   O2 Saturation Range (%): 86-98%    O2 Saturation (minutes) <=88%: 0.1 min  Pulse Rate Statistics:   Pulse Mean (bpm):    53/min    Pulse Range (34-103/min)   IMPRESSION: OSA (obstructive sleep apnea), mild  RECOMMENDATION:  This home sleep test demonstrates overall mild residual obstructive sleep apnea with a total AHI of 7.8/hour and O2 nadir of 86%. Snoring was intermittent and mostly in the mild range.  Her weight loss since her home sleep test from November 2020 has contributed to a significant improvement of her sleep disordered breathing.  Treatment options for mild obstructive sleep apnea include positive airway pressure treatment such as AutoPap therapy,  weight loss and avoidance of the supine sleep position, or an oral appliance (in appropriate candidates).   The patient should be cautioned not to drive, work at heights, or operate dangerous or heavy equipment when tired or sleepy. Review and reiteration of good sleep hygiene measures should be pursued with any patient. Other causes of the patient's symptoms, including circadian rhythm disturbances, an underlying mood disorder, medication effect and/or an underlying medical problem cannot be ruled out based on this test. Clinical correlation is recommended.  The patient and her referring provider will be notified of the test results. The patient will be seen in follow up in sleep clinic at [facility], as necessary.  I certify that I have reviewed the raw data recording prior to the issuance of this report in accordance with the standards of the American Academy of Sleep Medicine (AASM).  INTERPRETING PHYSICIAN:   Huston Foley, MD, PhD Medical Director, [facility]) Diplomat, ABPN (Neurology and Sleep)   [facility] [address], [address] [place], [place] [address] [phone]

## 2023-03-25 NOTE — Discharge Instructions (Signed)

## 2023-03-26 ENCOUNTER — Ambulatory Visit
Admission: RE | Admit: 2023-03-26 | Discharge: 2023-03-26 | Disposition: A | Discharge status: Home / Self Care | Payer: No Typology Code available for payment source | Source: Ambulatory Visit | Attending: Neurology | Admitting: Neurology

## 2023-03-26 ENCOUNTER — Ambulatory Visit (INDEPENDENT_AMBULATORY_CARE_PROVIDER_SITE_OTHER): Payer: No Typology Code available for payment source | Admitting: Sports Medicine

## 2023-03-26 ENCOUNTER — Ambulatory Visit (INDEPENDENT_AMBULATORY_CARE_PROVIDER_SITE_OTHER): Payer: No Typology Code available for payment source

## 2023-03-26 VITALS — BP 170/82 | HR 61

## 2023-03-26 VITALS — BP 132/80 | HR 65 | Ht 62.0 in | Wt 227.0 lb

## 2023-03-26 DIAGNOSIS — R930 Abnormal findings on diagnostic imaging of skull and head, not elsewhere classified: Secondary | ICD-10-CM

## 2023-03-26 DIAGNOSIS — M25539 Pain in unspecified wrist: Secondary | ICD-10-CM

## 2023-03-26 DIAGNOSIS — H93A2 Pulsatile tinnitus, left ear: Secondary | ICD-10-CM

## 2023-03-26 DIAGNOSIS — R519 Headache, unspecified: Secondary | ICD-10-CM

## 2023-03-26 DIAGNOSIS — G4733 Obstructive sleep apnea (adult) (pediatric): Secondary | ICD-10-CM

## 2023-03-26 DIAGNOSIS — G8929 Other chronic pain: Secondary | ICD-10-CM

## 2023-03-26 DIAGNOSIS — M25532 Pain in left wrist: Secondary | ICD-10-CM

## 2023-03-26 DIAGNOSIS — H538 Other visual disturbances: Secondary | ICD-10-CM

## 2023-03-26 DIAGNOSIS — Z9884 Bariatric surgery status: Secondary | ICD-10-CM

## 2023-03-26 DIAGNOSIS — Z8669 Personal history of other diseases of the nervous system and sense organs: Secondary | ICD-10-CM

## 2023-03-26 DIAGNOSIS — G4489 Other headache syndrome: Secondary | ICD-10-CM

## 2023-03-26 NOTE — Progress Notes (Signed)
    [name] [facility] [address] [place] [address] Phone: [phone]   Assessment and Plan:     1. Left wrist pain -Acute, uncomplicated, initial sports medicine visit - FOOSH injury to left wrist with pain and bruising over anatomical snuffbox.  No acute fracture and unremarkable appearance of scaphoid on x-ray imaging at today's visit, however due to [facility] injury, bruising, TTP over scaphoid, I recommend using thumb spica wrist brace for the next 1 week and repeat imaging in 1 week to ensure there is not a scaphoid fracture - Recommend Tylenol for day-to-day pain relief - Do not recommend NSAID use due to history of gastric bypass   Pertinent previous records reviewed include none   Follow Up: 1 week for repeat left wrist x-rays to rule out scaphoid fracture.  If no fracture, could wean off of wrist brace and start HEP   Subjective:   I, Stephanie Herrera, am serving as a Neurosurgeon for Doctor Richardean Sale  Chief Complaint: wrist pain   HPI:   03/26/23 Patient is a 52 year old female complaining of wrist pain. Patient states she went skating with her grandson this happened last night, RICES, she has bruising through her hand. Numbness and tingling. Decreased ROM. Decreased grip strength  Relevant Historical Information: History of gastric bypass, GERD  Additional pertinent review of systems negative.   Current Outpatient Medications:    CALCIUM PO, Take 500 mg by mouth in the morning and at bedtime., Disp: , Rfl:    Multiple Vitamins-Minerals (MULTIVITAMIN WITH MINERALS) tablet, Take 1 tablet by mouth daily. chewable, Disp: , Rfl:    triamcinolone cream (KENALOG) 0.1 %, As needed, Disp: , Rfl:    predniSONE (DELTASONE) 50 MG tablet, Take by mouth. Prior to contrast dye MRI on 8/7, Disp: , Rfl:    Objective:     Vitals:   03/26/23 1038  BP: 132/80  Pulse: 65  SpO2: 99%  Weight: 227 lb (103 kg)  Height: 5\' 2"  (1.575 m)       Body mass index is 41.52 kg/m.    Physical Exam:    General: Appears well, nad, nontoxic and pleasant Neuro:sensation intact, strength is 5/5 in upper extremities, muscle tone wnl Skin:no susupicious lesions or rashes  Left wrist:   No deformity Mild swelling and ecchymosis present over dorsum of hand wrist, primarily superficial to scaphoid ROM  Ext 70, flexion70, radial/ulnar deviation 20 TTP snuffbox nttp over dorsal carpals, volar carpals, radial styloid, ulnar styloid, 1st mcp, tfcc Negative Tinel's Negative finklestein Neg tfcc bounce test Mild pain with resisted ext, flex or deviation    Electronically signed by:  [name] [facility] 10:47 AM 03/26/23

## 2023-03-26 NOTE — Patient Instructions (Signed)
Tylenol [phone] mg 2-3 times a day for pain relief  Wrist brace RICES 1 week follow up

## 2023-03-27 ENCOUNTER — Telehealth: Payer: Self-pay | Admitting: Neurology

## 2023-03-27 DIAGNOSIS — G4733 Obstructive sleep apnea (adult) (pediatric): Secondary | ICD-10-CM

## 2023-03-27 DIAGNOSIS — G43019 Migraine without aura, intractable, without status migrainosus: Secondary | ICD-10-CM

## 2023-03-27 MED ORDER — NURTEC 75 MG PO TBDP: 75.0000 mg | ORAL_TABLET | Freq: Once | ORAL | 3 refills | Status: DC | PRN

## 2023-03-27 MED ORDER — ZONISAMIDE 25 MG PO CAPS: 75.0000 mg | ORAL_CAPSULE | Freq: Every day | ORAL | 3 refills | Status: DC

## 2023-03-27 MED ORDER — NURTEC 75 MG PO TBDP: 75.0000 mg | ORAL_TABLET | Freq: Once | ORAL | 3 refills | Status: AC | PRN

## 2023-03-27 NOTE — Telephone Encounter (Signed)
Please reach out to patient again regarding our plan for her workup and treatment:  During her first visit we had talked about the following:   1. We will do a brain MRI - done and resulted by MyChart message, please verify with patient.  2. We will get a formal eye exam, please go back to your optometrist or ophthalmologist at [facility] for a full, dilated eye exam.  In particular, we want to know if there is any swelling on your eye nerves called papilledema. - I am unsure if this has been done this yet.  I think it is important that she pursue a formal eye exam, including visual field testing.  3. We will request a LP (lumbar puncture/spinal tap) with pressure testing and routine fluid testing.  So far, routine labs are benign, it looks to me that it was a traumatic tap as there was an increased number of red blood cells and the fluid looked pinkish. - Please advise her that her opening pressure was borderline at 20 cm.  This can be normal in some patients and mildly elevated in some other patients.  As such, I do not think we need to start her on a medication called Diamox but since she has migraine headaches, we can try a medication called Topamax if she has not been on it for migraine management in the past.    4.  We will also do a repeat home sleep test - done and resulted via MyChart message, but she has not responded back.  Her sleep apnea has improved since her weight loss but she still has mild obstructive sleep apnea and it may be worth treating it with AutoPap therapy as she has headaches and it may even help with further weight loss as sleep apnea management can help improve metabolism.  Please inquire if she is agreeable to restarting AutoPap therapy.  She may not be eligible for a new machine quite yet.    5. We will do some blood work today - done and resulted via phone call.

## 2023-03-27 NOTE — Telephone Encounter (Signed)
I spoke with the patient and relayed the message from Dr. Frances Furbish.  The patient verbalized understanding she is amenable to trying both Zonegran and Nurtec.  I let her know Nurtec can only be taken once a day.  We went over the possible side effects of Zonegran and we went over the titration schedule.  Her questions were answered and she verbalized appreciation for the call.

## 2023-03-27 NOTE — Addendum Note (Signed)
Addended by: Bertram Savin on: 03/27/2023 04:21 PM   Modules accepted: Orders

## 2023-03-27 NOTE — Addendum Note (Signed)
Addended by: Huston Foley on: 03/27/2023 12:11 PM   Modules accepted: Orders

## 2023-03-27 NOTE — Telephone Encounter (Signed)
Referral to dentistry signed. For as needed use for acute migraine: We can try Nurtec 75 mg strength: Take 1 pill at onset of migraine headache, may repeat in 2 hours, no more than 2 pills in 24 hours. May cause sedation and nausea. For migraine prevention, we can try Zonegran 25 mg strength: 1 pill each night x 1 week, then 2 pills nightly x 1 week, then 3 pills nightly thereafter. Common side effects reported are: Sedation, sleepiness, dizziness, loss of appetite, tingling, change in taste especially with carbonated drinks, and more rarely problems with thinking including word finding difficulties.

## 2023-03-27 NOTE — Telephone Encounter (Signed)
Spoke with the patient and we discussed all of the results as noted below.  The patient states that she will be seeing [facility] at the end of October.  She is on the cancellation list.   Patient told me that she had tried Topamax in the past and she did not like it.  She felt stupid on it.  She had brain fog and felt slowed down mentally.  She is interested in alternatives recommended by Dr. Frances Furbish.  She also mentioned that she has used Maxalt when needed but now she wakes up with morning headaches.   In regards to patient's sleep study, patient did get the results.  She is not interested in retrying AutoPap therapy.  She is continuing her weight loss journey and has lost 100 pounds so far.  She states she has a body pillow to use to try to help with avoiding sleeping supine.  She did not realize how much she slept supine which was noted on the sleep study.  She is open to a referral to dentistry to be evaluated for oral appliance.   She is looking forward to further discussing all of this at her follow-up visit with Dr. Frances Furbish in November.  Will send patient a follow-up MyChart message from our call once I hear from Dr. Frances Furbish regarding medication.Marland Kitchen

## 2023-03-27 NOTE — Addendum Note (Signed)
Addended by: Bertram Savin on: 03/27/2023 11:16 AM   Modules accepted: Orders

## 2023-03-28 ENCOUNTER — Telehealth: Payer: Self-pay | Admitting: Neurology

## 2023-03-28 DIAGNOSIS — G971 Other reaction to spinal and lumbar puncture: Secondary | ICD-10-CM

## 2023-03-28 NOTE — Telephone Encounter (Signed)
Okay to do blood patch.

## 2023-03-28 NOTE — Telephone Encounter (Addendum)
Pt called stating that she is still having a headache so she called the facility where she had the Lumbar done and they informed her to call the ordering provider to send in a blood patch order. They will be needing this order by 10am tomorrow so that they can do it on Friday. Pt states that she is laying flat right now but she can be called if any questions. Please advise.

## 2023-03-28 NOTE — Telephone Encounter (Addendum)
I spoke with the patient.  She states she feels best when she is lying down but when she gets up her head hurts.  She is also trying caffeine in coffee and energy drinks but they are not really helpful.  She is on bedrest for 24 hours. She tried her new medication but it hasn't helped so far. She would like a blood patch order.  Patient was appreciative for the call back.  Order for blood patch pended to Dr Frances Furbish to sign

## 2023-03-29 ENCOUNTER — Ambulatory Visit
Admission: RE | Admit: 2023-03-29 | Discharge: 2023-03-29 | Disposition: A | Discharge status: Home / Self Care | Payer: No Typology Code available for payment source | Source: Ambulatory Visit | Attending: Neurology | Admitting: Neurology

## 2023-03-29 ENCOUNTER — Other Ambulatory Visit: Payer: Self-pay | Admitting: Neurology

## 2023-03-29 ENCOUNTER — Telehealth: Payer: Self-pay | Admitting: Neurology

## 2023-03-29 DIAGNOSIS — G971 Other reaction to spinal and lumbar puncture: Secondary | ICD-10-CM

## 2023-03-29 MED ORDER — DIPHENHYDRAMINE HCL 50 MG PO CAPS
50.0000 mg | ORAL_CAPSULE | Freq: Once | ORAL | Status: AC
  Administered 2023-03-29: 50 mg via ORAL

## 2023-03-29 MED ORDER — IOPAMIDOL (ISOVUE-M 200) INJECTION 41%
1.0000 mL | Freq: Once | INTRAMUSCULAR | Status: AC
  Administered 2023-03-29: 1 mL via EPIDURAL

## 2023-03-29 NOTE — Progress Notes (Signed)
20cc of blood drawn from pts LAC to be used for blood patch. 1 successful attempt. Pt tolerated well. Gauze and tape applied after.

## 2023-03-29 NOTE — Discharge Instructions (Signed)
Blood Patch Discharge Instructions ? ?Go home and rest quietly for the next 24 hours.  It is important to lie flat for the next 24 hours.  Get up only to go to the restroom.  You may lie in the bed or on a couch on your back, your stomach, your left side or your right side.  You may have one pillow under your head.  You may have pillows between your knees while you are on your side or under your knees while you are on your back. ? ?DO NOT drive today.  Recline the seat as far back as it will go, while still wearing your seat belt, on the way home. ? ?You may get up to go to the bathroom as needed.  You may sit up for 10 minutes to eat.  You may resume your normal diet and medications unless otherwise indicated.  Drink lots of extra fluids today and tomorrow..  ? ?You may resume normal activities after your 24 hours of bed rest is over; however, do not exert yourself strongly or do any heavy lifting tomorrow. ? ?Call your physician for a follow-up appointment.  ? ?If you have any questions  after you arrive home, please call [phone]. ? ?Discharge instructions have been explained to the patient.  The patient, or the person responsible for the patient, fully understands these instructions. ? ?   ?

## 2023-03-29 NOTE — Telephone Encounter (Signed)
Referral for dentistry fax to [facility]. Phone: [phone], Fax: [phone].

## 2023-03-29 NOTE — Progress Notes (Signed)
Pt arrived at [facility] for blood patch due to headache s/p L/P. A contrast allergy was noted in the patients chart. The patient denies having any shortness of breath or swelling with her reaction but she did note hives. Dr. Alfredo Batty was notified of this and ordered to give the pt 50 mg of benadryl prior to her Blood patch.

## 2023-04-01 NOTE — Progress Notes (Unsigned)
    [name] [facility] [address] [place] [address] Phone: [phone]   Assessment and Plan:     There are no diagnoses linked to this encounter.  ***   Pertinent previous records reviewed include ***   Follow Up: ***     Subjective:   I, Stephanie Herrera, am serving as a Neurosurgeon for Doctor Richardean Sale   Chief Complaint: wrist pain    HPI:    03/26/23 Patient is a 52 year old female complaining of wrist pain. Patient states she went skating with her grandson this happened last night, RICES, she has bruising through her hand. Numbness and tingling. Decreased ROM. Decreased grip strength  04/02/2023 Patient states   Relevant Historical Information: History of gastric bypass, GERD Additional pertinent review of systems negative.   Current Outpatient Medications:    CALCIUM PO, Take 500 mg by mouth in the morning and at bedtime., Disp: , Rfl:    Multiple Vitamins-Minerals (MULTIVITAMIN WITH MINERALS) tablet, Take 1 tablet by mouth daily. chewable, Disp: , Rfl:    predniSONE (DELTASONE) 50 MG tablet, Take by mouth. Prior to contrast dye MRI on 8/7, Disp: , Rfl:    Rimegepant Sulfate (NURTEC) 75 MG TBDP, Take 1 tablet (75 mg total) by mouth once as needed for up to 1 dose (migraine). Max 1 tablet in 24 hours., Disp: 10 tablet, Rfl: 3   triamcinolone cream (KENALOG) 0.1 %, As needed, Disp: , Rfl:    zonisamide (ZONEGRAN) 25 MG capsule, Take 3 capsules (75 mg total) by mouth at bedtime. This is the final dose, follow instructions provided., Disp: 90 capsule, Rfl: 3   Objective:     There were no vitals filed for this visit.    There is no height or weight on file to calculate BMI.    Physical Exam:    ***   Electronically signed by:  [name] [facility] 7:08 AM 04/01/23

## 2023-04-02 ENCOUNTER — Ambulatory Visit (INDEPENDENT_AMBULATORY_CARE_PROVIDER_SITE_OTHER): Payer: No Typology Code available for payment source

## 2023-04-02 ENCOUNTER — Ambulatory Visit (INDEPENDENT_AMBULATORY_CARE_PROVIDER_SITE_OTHER): Payer: No Typology Code available for payment source | Admitting: Sports Medicine

## 2023-04-02 VITALS — BP 118/80 | Ht 62.0 in | Wt 227.0 lb

## 2023-04-02 DIAGNOSIS — M25532 Pain in left wrist: Secondary | ICD-10-CM

## 2023-04-09 ENCOUNTER — Ambulatory Visit (INDEPENDENT_AMBULATORY_CARE_PROVIDER_SITE_OTHER): Payer: No Typology Code available for payment source

## 2023-04-09 ENCOUNTER — Ambulatory Visit (INDEPENDENT_AMBULATORY_CARE_PROVIDER_SITE_OTHER): Payer: No Typology Code available for payment source | Admitting: Sports Medicine

## 2023-04-09 VITALS — HR 68 | Ht 62.0 in | Wt 227.0 lb

## 2023-04-09 DIAGNOSIS — M25511 Pain in right shoulder: Secondary | ICD-10-CM

## 2023-04-09 DIAGNOSIS — M7531 Calcific tendinitis of right shoulder: Secondary | ICD-10-CM | POA: Diagnosis not present

## 2023-04-09 DIAGNOSIS — G8929 Other chronic pain: Secondary | ICD-10-CM | POA: Diagnosis not present

## 2023-04-09 NOTE — Progress Notes (Signed)
Stephanie Herrera [facility] [address] [place] [address] Phone: [phone]   Assessment and Plan:     1. Chronic right shoulder pain 2. 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  My interpretation: No acute fracture or dislocation.  Calcific changes at the greater tuberosity. - Patient elected for subacromial CSI.  Tolerated well per note below - I do not recommend NSAID use due to history of gastric bypass - Start HEP in physical therapy for rotator cuff  Procedure: Subacromial Injection Side: Right  Risks explained and consent was given verbally. The site was cleaned with alcohol prep. A steroid injection was performed from posterior approach using 2mL of 1% lidocaine without epinephrine and 1mL of kenalog 40mg /ml. This was well tolerated and resulted in symptomatic relief.  Needle was removed, hemostasis achieved, and post injection instructions were explained.   Pt was advised to call or return to clinic if these symptoms worsen or fail to improve as anticipated.   15 additional minutes spent for educating Therapeutic Home Exercise Program.  This included exercises focusing on stretching, strengthening, with focus on eccentric aspects.   Long term goals include an improvement in range of motion, strength, endurance as well as avoiding reinjury. Patient's frequency would include in 1-2 times a day, 3-5 times a week for a duration of 6-12 weeks. Proper technique shown and discussed handout in great detail with ATC.  All questions were discussed and answered.     Pertinent previous records reviewed include none   Follow Up: 5 to 6 weeks for reevaluation.  If no improvement or worsening of symptoms, could consider ultrasound and possibly  calcific debridement versus supraspinatus CSI versus MRI   Subjective:   I, Stephanie Herrera, am serving as a Neurosurgeon for Doctor Richardean Sale  Chief Complaint: right shoulder pain   HPI:   04/09/23 Patient is a 52 year old female complaining of right shoulder pain. Patient states that she has had shoulder pain for years. TTP through the deltoid. Pain has decreased. She thinks its over use. Decreased ROM. Pain radiates up to her trapp and neck and down her back to the scapula. Tylenol and ibu for the pain intermittently.    Relevant Historical Information: History of gastric bypass, GERD  Additional pertinent review of systems negative.   Current Outpatient Medications:    CALCIUM PO, Take 500 mg by mouth in the morning and at bedtime., Disp: , Rfl:    Multiple Vitamins-Minerals (MULTIVITAMIN WITH MINERALS) tablet, Take 1 tablet by mouth daily. chewable, Disp: , Rfl:    Rimegepant Sulfate (NURTEC) 75 MG TBDP, Take 1 tablet (75 mg total) by mouth once as needed for up to 1 dose (migraine). Max 1 tablet in 24 hours., Disp: 10 tablet, Rfl: 3   triamcinolone cream (KENALOG) 0.1 %, As needed, Disp: , Rfl:    zonisamide (ZONEGRAN) 25 MG capsule, Take 3 capsules (75 mg total) by mouth at bedtime. This is the final dose, follow instructions provided., Disp: 90 capsule, Rfl: 3   predniSONE (DELTASONE) 50 MG tablet, Take by mouth. Prior to contrast dye MRI on 8/7, Disp: , Rfl:    Objective:     Vitals:   04/09/23 1032  Pulse: 68  SpO2: 97%  Weight: 227 lb (103 kg)  Height: 5\' 2"  (1.575  m)      Body mass index is 41.52 kg/m.    Physical Exam:    Gen: Appears well, nad, nontoxic and pleasant Neuro:sensation intact, strength is 5/5 with df/pf/inv/ev, muscle tone wnl Skin: no suspicious lesion or defmority Psych: A&O, appropriate mood and affect  Right shoulder:  No deformity, swelling or muscle wasting No scapular winging FF 180, abd 180 with painful arc, int 15, ext 90 TTP  deltoid, trapezius NTTP over the [place], clavicle, ac, coracoid, biceps groove, humerus,  cervical spine Positive neer, hawkins, empty can, obriens, crossarm, Negative subscap liftoff, speeds Neg ant drawer, sulcus sign, apprehension Negative Spurling's test bilat FROM of neck    Electronically signed by:  Stephanie Herrera [facility] 11:18 AM 04/09/23

## 2023-04-09 NOTE — Progress Notes (Deleted)
    [name] [facility] [address] [place] [address] Phone: [phone]   Assessment and Plan:     There are no diagnoses linked to this encounter.  ***   Pertinent previous records reviewed include ***   Follow Up: ***     Subjective:    Chief Complaint: ***  HPI:   04/09/23 ***  Relevant Historical Information: ***  Additional pertinent review of systems negative.   Current Outpatient Medications:    CALCIUM PO, Take 500 mg by mouth in the morning and at bedtime., Disp: , Rfl:    Multiple Vitamins-Minerals (MULTIVITAMIN WITH MINERALS) tablet, Take 1 tablet by mouth daily. chewable, Disp: , Rfl:    predniSONE (DELTASONE) 50 MG tablet, Take by mouth. Prior to contrast dye MRI on 8/7, Disp: , Rfl:    Rimegepant Sulfate (NURTEC) 75 MG TBDP, Take 1 tablet (75 mg total) by mouth once as needed for up to 1 dose (migraine). Max 1 tablet in 24 hours., Disp: 10 tablet, Rfl: 3   triamcinolone cream (KENALOG) 0.1 %, As needed, Disp: , Rfl:    zonisamide (ZONEGRAN) 25 MG capsule, Take 3 capsules (75 mg total) by mouth at bedtime. This is the final dose, follow instructions provided., Disp: 90 capsule, Rfl: 3   Objective:     There were no vitals filed for this visit.    There is no height or weight on file to calculate BMI.    Physical Exam:    ***   Electronically signed by:  [name] [facility] 7:38 AM 04/09/23

## 2023-04-09 NOTE — Patient Instructions (Signed)
Shoulder HEP  PT referral  5-6 week follow up

## 2023-04-10 ENCOUNTER — Ambulatory Visit: Payer: No Typology Code available for payment source | Admitting: Sports Medicine

## 2023-04-11 LAB — CSF CULTURE W GRAM STAIN
MICRO NUMBER:: 15445601
Result:: NO GROWTH
SPECIMEN QUALITY:: ADEQUATE

## 2023-04-11 LAB — CSF CELL COUNT WITH DIFFERENTIAL
RBC Count, CSF: 1250 cells/uL — ABNORMAL HIGH
TOTAL NUCLEATED CELL: 0 cells/uL (ref 0–5)

## 2023-04-11 LAB — GLUCOSE, CSF: Glucose, CSF: 56 mg/dL (ref 40–80)

## 2023-04-11 LAB — PROTEIN, CSF: Total Protein, CSF: 29 mg/dL (ref 15–45)

## 2023-04-30 NOTE — Progress Notes (Deleted)
    [name] [facility] [address] [place] [address] Phone: [phone]   Assessment and Plan:     There are no diagnoses linked to this encounter.  ***   Pertinent previous records reviewed include ***   Follow Up: ***     Subjective:   I, Stephanie Herrera, am serving as a Neurosurgeon for Doctor Richardean Sale   Chief Complaint: right shoulder pain    HPI:    04/09/23 Patient is a 52 year old female complaining of right shoulder pain. Patient states that she has had shoulder pain for years. TTP through the deltoid. Pain has decreased. She thinks its over use. Decreased ROM. Pain radiates up to her trapp and neck and down her back to the scapula. Tylenol and ibu for the pain intermittently.    05/13/2023 Patient states   Relevant Historical Information: History of gastric bypass, GERD  Additional pertinent review of systems negative.   Current Outpatient Medications:    CALCIUM PO, Take 500 mg by mouth in the morning and at bedtime., Disp: , Rfl:    Multiple Vitamins-Minerals (MULTIVITAMIN WITH MINERALS) tablet, Take 1 tablet by mouth daily. chewable, Disp: , Rfl:    predniSONE (DELTASONE) 50 MG tablet, Take by mouth. Prior to contrast dye MRI on 8/7, Disp: , Rfl:    Rimegepant Sulfate (NURTEC) 75 MG TBDP, Take 1 tablet (75 mg total) by mouth once as needed for up to 1 dose (migraine). Max 1 tablet in 24 hours., Disp: 10 tablet, Rfl: 3   triamcinolone cream (KENALOG) 0.1 %, As needed, Disp: , Rfl:    zonisamide (ZONEGRAN) 25 MG capsule, Take 3 capsules (75 mg total) by mouth at bedtime. This is the final dose, follow instructions provided., Disp: 90 capsule, Rfl: 3   Objective:     There were no vitals filed for this visit.    There is no height or weight on file to calculate BMI.    Physical Exam:    ***   Electronically signed by:  [name] [facility] 8:47 AM 04/30/23

## 2023-05-14 ENCOUNTER — Ambulatory Visit: Payer: No Typology Code available for payment source | Admitting: Sports Medicine

## 2023-05-23 ENCOUNTER — Ambulatory Visit: Payer: No Typology Code available for payment source | Admitting: Neurology

## 2023-05-23 ENCOUNTER — Other Ambulatory Visit: Payer: Self-pay | Admitting: Neurology

## 2023-05-23 DIAGNOSIS — G43019 Migraine without aura, intractable, without status migrainosus: Secondary | ICD-10-CM

## 2023-05-27 ENCOUNTER — Ambulatory Visit (INDEPENDENT_AMBULATORY_CARE_PROVIDER_SITE_OTHER): Payer: No Typology Code available for payment source | Admitting: Neurology

## 2023-05-27 VITALS — BP 130/72 | HR 58 | Ht 63.0 in | Wt 229.4 lb

## 2023-05-27 DIAGNOSIS — G43019 Migraine without aura, intractable, without status migrainosus: Secondary | ICD-10-CM | POA: Diagnosis not present

## 2023-05-27 DIAGNOSIS — H93A2 Pulsatile tinnitus, left ear: Secondary | ICD-10-CM

## 2023-05-27 DIAGNOSIS — E236 Other disorders of pituitary gland: Secondary | ICD-10-CM | POA: Diagnosis not present

## 2023-05-27 DIAGNOSIS — G4733 Obstructive sleep apnea (adult) (pediatric): Secondary | ICD-10-CM | POA: Diagnosis not present

## 2023-05-27 NOTE — Progress Notes (Signed)
Subjective:    Patient ID: Stephanie Herrera is a 52 y.o. female.  HPI    Interim history:   Stephanie Herrera is a 52 year old female with an underlying medical history of reflux disease, kidney stones, vitamin D deficiency, hyperlipidemia, migraine headaches, obstructive sleep apnea, obesity with s/p gastric bypass in April 2021, who presents for follow-up consultation of her migraine headaches.  The patient is unaccompanied today.  I saw her on 02/20/2023, at which time she reported a several year history of migraine headaches.  She reported a left pulsatile tinnitus for years.  She had seen ENT.  She was supposed to have a tympanoplasty and mastoidectomy.  She had an MRI of the internal auditory canals and posterior fossa with and without contrast through [facility] on 02/20/2023 and I reviewed the results:    IMPRESSION:  1. No mass or abnormal enhancement of the visualized cranial nerves.  2.  LEFT mastoid and middle ear effusion. No evidence of intracranial extension.   I suggested we proceed with additional testing including a brain MRI, reevaluation of her obstructive sleep apnea with a home sleep test.  She was also advised to proceed with a lumbar puncture and pursue a formal eye examination with ophthalmology.    Her lumbar puncture from 03/26/2023 showed a borderline opening pressure of 20 cm.  She had a brain MRI with and without contrast on 03/13/2023 and I reviewed the results:    IMPRESSION: MRI scan of the brain with and without contrast showing only minor nonspecific subcortical white matter hyperintensities with the differential discussed above.  Incidental findings of left mastoid effusion and partially empty sella and noted.  Likely no significant change compared with previous outside MRI report from 02/20/2023.  She required a blood patch after her lumbar puncture as she developed a post LP headache.  She had a home sleep test through our office on 03/10/2023 which showed  mild obstructive sleep apnea with an AHI of 7.8/h, O2 nadir 86%.  She was advised about her home sleep test results and opted to pursue a dental device through dentistry.  I made a referral to Dr. Toni Arthurs.    She was advised to start zonisamide for migraine prevention and Nurtec as needed for acute migraines.  Today, 05/27/2023: She reports doing better.  She delayed her zonisamide start because she had inner ear surgery.  She has an implant, surgical records are not available for my review in her chart.  She saw her optometrist in the interim at [facility] and reports that she was told to follow-up in 1 year.  She had a change in her eyeglass prescription from bifocals to only reading glasses.  The lumbar puncture itself was not bad but the blood patch was painful.  However, she did improve after the blood patch and did not have any migraines for weeks.  She tried Nurtec once which did not help as far she can recall.  She just recently increased her Zonegran to 75 mg at bedtime.  She tolerates it, no major side effects.  She is motivated to continue with treatment.  She is working on weight loss, she hydrates well, no daily caffeine.  Her tinnitus is better.  She still has some pulsatile tinnitus.  The patient's allergies, current medications, family history, past medical history, past social history, past surgical history and problem list were reviewed and updated as appropriate.   Previously:  02/20/2023: (She) reports recurrent migraines for years.  Sometimes she has  associated blurry vision and nausea, she usually takes ibuprofen as needed.  She has had left-sided pulsatile tinnitus for years.  She started having ear pain in June and had a severe infection with copious drainage.  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  She is scheduled for a ear MRI today.  She will eventually have tympanoplasty and mastoidectomy on the left as well  as ossicular chain reconstruction per ENT.  She has blurry vision when she has a migraine, she tries to avoid taking ibuprofen.  She admits that she does not always hydrate well with water.  She is a non-smoker and drinks alcohol rarely, limits her coffee to usually 1 cup/day.  She has not had any one-sided weakness or numbness or tingling.  She denies any loss of vision.  She had a corneal abrasion on the right eye recently.  She goes to [facility].   Of note, I had evaluated her for obstructive sleep apnea in November 2020.  She had a home sleep test on 06/15/2019, which showed  moderate/near-severe obstructive sleep apnea with a total AHI of 29.3/hour and O2 nadir of 78%.  She was advised to proceed with home AutoPap therapy.  She did not have any subsequent follow-up appointments.  She reports that she stopped using her AutoPap in 2021 after her bariatric surgery, she reports significant weight loss of approximately 100 pounds since her surgery.     05/28/2019: Stephanie Herrera is a 75 year old right-handed woman with an underlying medical history of migraine headaches, hyperlipidemia, vitamin D deficiency, status post right knee surgery, status post left foot surgery, and morbid obesity with a BMI of over 50, who reports snoring and excessive daytime somnolence. I reviewed your office note from 05/07/2019, which you kindly included. She is being evaluated for bariatric surgery.  Her Epworth sleepiness score is 12 out of 24 today, fatigue severity score is 49 out of 63.  She lives with her fianc.  She has 1 child, 3 yo son.  She is a non-smoker and drinks alcohol occasionally, once or twice a month, caffeine daily in the form of coffee, up to 3 or 4/day on average, she admits that she does not drink a whole lot of fluids.  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  She has been working from home since before COVID-19.  She does not wake up  rested.  She tries to be in bed between 8 and 9 and rise time is around 7.  Nevertheless, she is tired during the day, she wakes up in the middle of the night sometimes with a sharp headache and sometimes in the morning.  The headache is short-lived.  She had an eye examination in February 2020 and is due next February.  She has a dog in the household, is not aware of any family history of OSA.  Had a tonsillectomy and adenoidectomy as a child as she snored loudly. She had a CT scan of the temporal bone without contrast through [facility] on 01/01/2023 and I have reviewed the results:  IMPRESSION:   1. Fluid, mostly low density, within the left mastoid air cells and middle ear. Cholesteatoma less likely. (Is there an abnormal otoscopic exam--white reflex?)  2. Possible areas of dehiscent in the floor of the left middle cranial fossa and right anterior cranial fossa as above. Enlarged sella turcica, small ventricles, sulci  spaces and basilar cisterns. Correlate for history that might suggest IIH, and with funduscopic exam and/or lumbar puncture.    She has not been seen by ophthalmology.  Her Past Medical History Is Significant For: Past Medical History:  Diagnosis Date   Achilles rupture, near complete, right 2008   Avitaminosis D 03/02/2014   GERD (gastroesophageal reflux disease)    History of kidney stones    HLD (hyperlipidemia) 06/18/2012   Migraine    Painful orthopaedic hardware (HCC) removal, left foot    Sleep apnea    cpap    Her Past Surgical History Is Significant For: Past Surgical History:  Procedure Laterality Date   FOOT SURGERY Left 11/2013   Dr Victorino Dike, tendon repair and lowered arch  ankle involved also   GASTRIC ROUX-EN-Y  11/09/2019   Procedure: LAPAROSCOPIC ROUX-EN-Y GASTRIC BYPASS WITH UPPER ENDOSCOPY;  Surgeon: Gaynelle Adu, MD;  Location: [facility];  Service: General;;   HARDWARE REMOVAL Left 05/19/2015   Procedure: LEFT FOOT REMOVAL DEEP IMPLANT HARDWARE FROM  CALCANEOUS AND FIRST METATARSAL;  Surgeon: Toni Arthurs, MD;  Location:  SURGERY CENTER;  Service: Orthopedics;  Laterality: Left;   KNEE ARTHROSCOPY Right    TONSILLECTOMY     and adneoids    Her Family History Is Significant For: Family History  Problem Relation Age of Onset   Hyperlipidemia Mother    Lung cancer Father    Hemophilia Father    HIV Father        from transfusion    Her Social History Is Significant For: Social History   Socioeconomic History   Marital status: Single    Spouse name: Not on file   Number of children: 1   Years of education: 14   Highest education level: Not on file  Occupational History   Not on file  Tobacco Use   Smoking status: Never   Smokeless tobacco: Never  Vaping Use   Vaping status: Never Used  Substance and Sexual Activity   Alcohol use: Yes    Alcohol/week: 0.0 - 1.0 standard drinks of alcohol    Comment: social; none since 01/11/23   Drug use: No   Sexual activity: Not Currently    Birth control/protection: None  Other Topics Concern   Not on file  Social History Narrative   Caffeine: 2 cups/day   Social Determinants of Health   Financial Resource Strain: Low Risk  (12/25/2022)   Received from [facility]   Overall Financial Resource Strain (CARDIA)    Difficulty of Paying Living Expenses: Not hard at all  Food Insecurity: No Food Insecurity (12/25/2022)   Received [facility]   Hunger Vital Sign    Worried About Running Out of Food in the Last Year: Never true    Ran Out of Food in the Last Year: Never true  Transportation Needs: No Transportation Needs (12/25/2022)   Received from [facility] - Transportation    Lack of Transportation (Medical): No    Lack of Transportation (Non-Medical): No  Physical Activity: Sufficiently Active (12/25/2022)   Received from [facility]   Exercise Vital Sign    Days of Exercise per Week: 2 days    Minutes of Exercise per Session: 150+ min   Stress: No Stress Concern Present (12/25/2022)   Received from [facility] - Occupational Stress Questionnaire    Feeling of Stress : Only a little  Social Connections: Moderately Integrated (12/25/2022)  Received from [facility]   Social Network    How would you rate your social network (family, work, friends)?: Adequate participation with social networks    Her Allergies Are:  Allergies  Allergen Reactions   Hydrocodone Itching    She is able to tolerate hydromorphone    Iodinated Contrast Media Hives, Shortness Of Breath and Swelling    Other Reaction(s): Not available   Simvastatin     Jaundice  :   Her Current Medications Are:  Outpatient Encounter Medications as of 05/27/2023  Medication Sig   CALCIUM PO Take 500 mg by mouth in the morning and at bedtime.   Multiple Vitamins-Minerals (MULTIVITAMIN WITH MINERALS) tablet Take 1 tablet by mouth daily. chewable   Rimegepant Sulfate (NURTEC) 75 MG TBDP Take 1 tablet (75 mg total) by mouth once as needed for up to 1 dose (migraine). Max 1 tablet in 24 hours.   triamcinolone cream (KENALOG) 0.1 % As needed   zonisamide (ZONEGRAN) 25 MG capsule TAKE 3 CAPSULES (75 MG TOTAL) BY MOUTH AT BEDTIME. THIS IS THE FINAL DOSE, FOLLOW INSTRUCTIONS PROVIDED.   [DISCONTINUED] predniSONE (DELTASONE) 50 MG tablet Take by mouth. Prior to contrast dye MRI on 8/7   No facility-administered encounter medications on file as of 05/27/2023.  :  Review of Systems:  Out of a complete 14 point review of systems, all are reviewed and negative with the exception of these symptoms as listed below:  Review of Systems  Neurological:        LP done, had Blood Patch. ESS 10. Slight headache , vision better.  No dental device, expensive. Taking zonegram 75mg  po daily.    Objective:  Neurological Exam  Physical Exam Physical Examination:   Vitals:   05/27/23 1019  BP: 130/72  Pulse: (!) 58  SpO2: 98%     General Examination: The patient is a very pleasant 52 y.o. female in no acute distress. She appears well-developed and well-nourished and well groomed.   HEENT: Normocephalic, atraumatic, pupils are equal, round and reactive to light, no photophobia.  Funduscopic exam benign.  Healing scar behind the left ear.  Hearing is grossly intact.  Face is symmetric with normal facial animation. Speech is clear with no dysarthria noted. There is no hypophonia. There is no lip, neck/head, jaw or voice tremor. Neck is supple with full range of passive and active motion. There are no carotid bruits on auscultation. Oropharynx exam reveals: mild mouth dryness, adequate dental hygiene and mild airway crowding. Tongue protrudes centrally and palate elevates symmetrically.    Chest: Clear to auscultation without wheezing, rhonchi or crackles noted.   Heart: S1+S2+0, regular and normal without murmurs, rubs or gallops noted.    Abdomen: Soft, non-tender and non-distended.   Extremities: There is nonpitting puffiness noted in the ankle areas.      Skin: Warm and dry without trophic changes noted.    Musculoskeletal: exam reveals no obvious joint deformities.    Neurologically:  Mental status: The patient is awake, alert and oriented in all 4 spheres. Her immediate and remote memory, attention, language skills and fund of knowledge are appropriate. There is no evidence of aphasia, agnosia, apraxia or anomia. Speech is clear with normal prosody and enunciation. Thought process is linear. Mood is normal and affect is normal.  Cranial nerves II - XII are as described above under HEENT exam.  Motor exam: Normal bulk, strength and tone is noted.  No postural tremor or action tremor.  No intention  tremor.     Fine motor skills and coordination: Grossly intact.   Cerebellar testing: No dysmetria or intention tremor. There is no truncal or gait ataxia. Sensory exam: intact to light touch in the upper and lower  extremities.  Gait, station and balance: She stands easily. No veering to one side is noted. No leaning to one side is noted. Posture is age-appropriate and stance is narrow based. Gait shows normal stride length and normal pace. No problems turning are noted.    Assessment and Plan:  In summary, Stephanie Herrera is a 52 year old female with an underlying medical history of reflux disease, kidney stones, vitamin D deficiency, hyperlipidemia, migraine headaches, obstructive sleep apnea, obesity with s/p gastric bypass in April 2021, who presents for follow-up consultation of her migraine headaches.  She was found to have an enlarged sella turcica as an incidental finding on a recent head CT scan to evaluate her temporal bones.  She has since then undergone left inner ear surgery.  Her lumbar puncture from 03/26/2023 showed a borderline opening pressure of 20 cm.  She had a brain MRI with and without contrast on 03/13/2023 showed minor nonspecific subcortical white matter hyperintensities, left mastoid effusion and partially empty sella.  She required a blood patch after her lumbar puncture as she developed a post LP headache.  She had a home sleep test through our office on 03/10/2023 which showed mild obstructive sleep apnea with an AHI of 7.8/h, O2 nadir 86%.  She was advised about her home sleep test results and opted to pursue a dental device through dentistry.  I made a referral to Dr. Toni Arthurs.  She did not end up pursuing a dental device due to cost.  She continues to work on weight loss.  She has been on zonisamide for migraine prevention, currently on 75 mg strength once daily.  She tolerates the medication.  She has tried Nurtec as needed once.  Her exam is stable.  She has new eyeglasses which are pending.  She had an eye examination through optometry, she may be able to get a report.  We talked about headache triggers.  She is encouraged to continue to take zonisamide 75 mg nightly and  Nurtec as needed.  She is advised to stay well-hydrated and well rested and continue to work on weight loss.  She is encouraged to follow-up in this clinic to see one of our nurse practitioners in 6 months routinely, sooner if needed.  I answered all her questions today and she was in agreement.  I spent 40 minutes in total face-to-face time and in reviewing records during pre-charting, more than 50% of which was spent in counseling and coordination of care, reviewing test results, reviewing medications and treatment regimen and/or in discussing or reviewing the diagnosis of migraine headaches, mild obstructive sleep apnea, empty sella, the prognosis and treatment options. Pertinent laboratory and imaging test results that were available during this visit with the patient were reviewed by me and considered in my medical decision making (see chart for details).

## 2023-05-30 ENCOUNTER — Telehealth: Payer: Self-pay | Admitting: *Deleted

## 2023-06-07 ENCOUNTER — Encounter (HOSPITAL_COMMUNITY): Payer: Self-pay | Admitting: *Deleted

## 2023-08-12 ENCOUNTER — Ambulatory Visit (INDEPENDENT_AMBULATORY_CARE_PROVIDER_SITE_OTHER): Payer: No Typology Code available for payment source | Admitting: Sports Medicine

## 2023-08-12 VITALS — BP 116/78 | HR 77 | Ht 63.0 in | Wt 233.0 lb

## 2023-08-12 DIAGNOSIS — M25511 Pain in right shoulder: Secondary | ICD-10-CM

## 2023-08-12 DIAGNOSIS — G8929 Other chronic pain: Secondary | ICD-10-CM | POA: Diagnosis not present

## 2023-08-12 DIAGNOSIS — M7531 Calcific tendinitis of right shoulder: Secondary | ICD-10-CM | POA: Diagnosis not present

## 2023-08-12 NOTE — Progress Notes (Signed)
Stephanie Herrera D.[facility] [address] [place] [address] Phone: [phone]   Assessment and Plan:     1. Chronic right shoulder pain 2. Calcific tendinitis of right shoulder -Chronic with exacerbation, subsequent visit - Still most consistent with recurrent flare of calcific tendinitis of supraspinatus and right shoulder - Patient received 3+ months relief after subacromial CSI on 04/09/2023 and elected for repeat today.  Tolerated well per note below - Due to recurrent shoulder pain, failure to significantly improved with >6 weeks of conservative therapy, pain at times >6/10, and pain affecting day-to-day activities, recommend further evaluation with right shoulder MRI.  Order placed today - Start physical therapy for right shoulder.  Referral sent -I do not recommend prescription NSAIDs due to history of gastric bypass  Procedure: Subacromial Injection Side: Right  Risks explained and consent was given verbally. The site was cleaned with alcohol prep. A steroid injection was performed from posterior approach using 2mL of 1% lidocaine without epinephrine and 1mL of kenalog 40mg /ml. This was well tolerated and resulted in symptomatic relief.  Needle was removed, hemostasis achieved, and post injection instructions were explained.   Pt was advised to call or return to clinic if these symptoms worsen or fail to improve as anticipated.    Pertinent previous records reviewed include none  Follow Up: 5 days after MRI to review results and discuss treatment plan   Subjective:   I, Stephanie Herrera, am serving as a Neurosurgeon for Doctor Stephanie Herrera  Chief Complaint: right shoulder pain   HPI:   04/09/23 Patient is a 53 year old female complaining of right shoulder pain. Patient states that she has had shoulder pain for years. TTP through the deltoid. Pain has decreased. She thinks its over use. Decreased ROM. Pain radiates up to her trapp and  neck and down her back to the scapula. Tylenol and ibu for the pain intermittently.     Relevant Historical Information: History of gastric bypass, GERD  08/12/23 Patient is a 53 year old female with right shoulder pain. Patient states that on Tuesday night she has had a pain flare.. no MOI. Was seen in [facility] 08/09/2023 Clinic for evaluation of pain in the right shoulder. This has been a chronic ongoing issue as noted above; however, reports pain has worsened over the past 3 to 4 days. Reports prior to that time she had been cleaning out a building and doing a lot of repetitive work with the right arm but no heavy lifting. Pain is localized in the deltoid region of the arm, described as constant dull, soreness, and achiness with intermittent sharp pains. She reports at times intermittent numbness and tingling down the arm to the fingers and muscle spasms in the arm. Pain is worsened with range of motion of the shoulder and states she cannot lift the right arm due to the pain. She has been using ice, heat, Motrin, Bengay, and Biofreeze.  Toradol helped but she still has pain and decreased ROM, sling has helped a lot. Pain down the arm and to the hand constant   Relevant Historical Information: History of gastric bypass, GERD  Additional pertinent review of systems negative.   Current Outpatient Medications:    CALCIUM PO, Take 500 mg by mouth in the morning and at bedtime., Disp: , Rfl:    Multiple Vitamins-Minerals (MULTIVITAMIN WITH MINERALS) tablet, Take 1 tablet by mouth daily. chewable, Disp: , Rfl:    Rimegepant Sulfate (NURTEC) 75 MG  TBDP, Take 1 tablet (75 mg total) by mouth once as needed for up to 1 dose (migraine). Max 1 tablet in 24 hours., Disp: 10 tablet, Rfl: 3   triamcinolone cream (KENALOG) 0.1 %, As needed, Disp: , Rfl:    zonisamide (ZONEGRAN) 25 MG capsule, TAKE 3 CAPSULES (75 MG TOTAL) BY MOUTH AT BEDTIME. THIS IS THE FINAL DOSE, FOLLOW INSTRUCTIONS PROVIDED., Disp: 270 capsule,  Rfl: 0   Objective:     Vitals:   08/12/23 1306  BP: 116/78  Pulse: 77  SpO2: 99%  Weight: 233 lb (105.7 kg)  Height: 5\' 3"  (1.6 m)      Body mass index is 41.27 kg/m.    Physical Exam:    Gen: Appears well, nad, nontoxic and pleasant Neuro:sensation intact, strength is 5/5 with df/pf/inv/ev, muscle tone wnl Skin: no suspicious lesion or defmority Psych: A&O, appropriate mood and affect   Right shoulder:  No deformity, swelling or muscle wasting No scapular winging FF  80, abd  80 with painful arc, int 30, ext 70 TTP deltoid, trapezius NTTP over the [place], clavicle, ac, coracoid, biceps groove, humerus,  cervical spine Positive neer, hawkins, empty can, obriens, crossarm, Negative subscap liftoff, speeds Neg ant drawer, sulcus sign, apprehension Negative Spurling's test bilat FROM of neck     Electronically signed by:  Stephanie Herrera D.[facility] 1:28 PM 08/12/23

## 2023-08-12 NOTE — Patient Instructions (Signed)
Pt referral  Shoulder MRI  Follow up 5 days after to discuss results

## 2023-08-13 ENCOUNTER — Other Ambulatory Visit: Payer: No Typology Code available for payment source

## 2023-08-13 ENCOUNTER — Ambulatory Visit: Payer: No Typology Code available for payment source

## 2023-08-13 DIAGNOSIS — M25511 Pain in right shoulder: Secondary | ICD-10-CM

## 2023-08-13 DIAGNOSIS — M7531 Calcific tendinitis of right shoulder: Secondary | ICD-10-CM

## 2023-08-13 DIAGNOSIS — G8929 Other chronic pain: Secondary | ICD-10-CM

## 2023-08-19 NOTE — Progress Notes (Signed)
 Ben Keyra Virella D.[facility] [address] [place] [address] Phone: [phone]   Assessment and Plan:     1. Chronic right shoulder pain 2. Calcific tendinitis of right shoulder  -Chronic with exacerbation, subsequent visit - Still consistent with recurrent flare of rotator cuff tendinitis, mainly supraspinatus.  Overall improvement after subacromial CSI at previous office visit on 08/12/2023 - Reviewed MRI which showed mild to moderate supraspinatus tendinitis, mild infraspinatus tendinitis, mild partial tearing of infraspinatus tendon and supraspinatus musculature without retraction - Continue Tylenol  for day-to-day pain relief - I do not recommend NSAIDs due to past medical history of gastric bypass - Continue HEP and start physical therapy.  First appointment tomorrow  Pertinent previous records reviewed include right shoulder MRI 08/13/2023  Follow Up: As needed for reevaluation.  Could consider repeat CSI   Subjective:   I, Moenique Parris, am serving as a neurosurgeon for Doctor Morene Mace   Chief Complaint: right shoulder pain    HPI:    04/09/23 Patient is a 53 year old female complaining of right shoulder pain. Patient states that she has had shoulder pain for years. TTP through the deltoid. Pain has decreased. She thinks its over use. Decreased ROM. Pain radiates up to her trapp and neck and down her back to the scapula. Tylenol  and ibu for the pain intermittently.     Relevant Historical Information: History of gastric bypass, GERD   08/12/23 Patient is a 53 year old female with right shoulder pain. Patient states that on Tuesday night she has had a pain flare.. no MOI. Was seen in [facility] 08/09/2023 Clinic for evaluation of pain in the right shoulder. This has been a chronic ongoing issue as noted above; however, reports pain has worsened over the past 3 to 4 days. Reports prior to that time she had been cleaning out a building and  doing a lot of repetitive work with the right arm but no heavy lifting. Pain is localized in the deltoid region of the arm, described as constant dull, soreness, and achiness with intermittent sharp pains. She reports at times intermittent numbness and tingling down the arm to the fingers and muscle spasms in the arm. Pain is worsened with range of motion of the shoulder and states she cannot lift the right arm due to the pain. She has been using ice, heat, Motrin, Bengay, and Biofreeze.  Toradol helped but she still has pain and decreased ROM, sling has helped a lot. Pain down the arm and to the hand constant    08/20/2023 Patient states that she is much better. CSI helped a lot . Still has some soreness and catching   Relevant Historical Information: History of gastric bypass, GERD Additional pertinent review of systems negative.   Current Outpatient Medications:    CALCIUM PO, Take 500 mg by mouth in the morning and at bedtime., Disp: , Rfl:    Multiple Vitamins-Minerals (MULTIVITAMIN WITH MINERALS) tablet, Take 1 tablet by mouth daily. chewable, Disp: , Rfl:    Rimegepant Sulfate (NURTEC) 75 MG TBDP, Take 1 tablet (75 mg total) by mouth once as needed for up to 1 dose (migraine). Max 1 tablet in 24 hours., Disp: 10 tablet, Rfl: 3   triamcinolone  cream (KENALOG) 0.1 %, As needed, Disp: , Rfl:    zonisamide  (ZONEGRAN ) 25 MG capsule, TAKE 3 CAPSULES (75 MG TOTAL) BY MOUTH AT BEDTIME. THIS IS THE FINAL DOSE, FOLLOW INSTRUCTIONS PROVIDED., Disp: 270 capsule, Rfl: 0  Objective:     Vitals:   08/20/23 1032  Pulse: 67  SpO2: 99%  Weight: 229 lb (103.9 kg)  Height: 5' 3 (1.6 m)      Body mass index is 40.57 kg/m.    Physical Exam:    Gen: Appears well, nad, nontoxic and pleasant Neuro:sensation intact, strength is 5/5 with df/pf/inv/ev, muscle tone wnl Skin: no suspicious lesion or defmority Psych: A&O, appropriate mood and affect   Right shoulder:  No deformity, swelling or muscle  wasting No scapular winging FF 140, abd 110 with painful arc, int 15, ext 80 TTP deltoid, trapezius NTTP over the Gilliam, clavicle, ac, coracoid, biceps groove, humerus,  cervical spine Positive neer, hawkins, empty can, obriens, crossarm, Negative subscap liftoff, speeds Neg ant drawer, sulcus sign, apprehension Negative Spurling's test bilat FROM of neck     Electronically signed by:  Odis Mace D.[facility] 10:44 AM 08/20/23

## 2023-08-20 ENCOUNTER — Ambulatory Visit (INDEPENDENT_AMBULATORY_CARE_PROVIDER_SITE_OTHER): Payer: No Typology Code available for payment source | Admitting: Sports Medicine

## 2023-08-20 VITALS — HR 67 | Ht 63.0 in | Wt 229.0 lb

## 2023-08-20 DIAGNOSIS — M25511 Pain in right shoulder: Secondary | ICD-10-CM

## 2023-08-20 DIAGNOSIS — G8929 Other chronic pain: Secondary | ICD-10-CM

## 2023-08-20 DIAGNOSIS — M7531 Calcific tendinitis of right shoulder: Secondary | ICD-10-CM | POA: Diagnosis not present

## 2023-09-24 ENCOUNTER — Ambulatory Visit: Admitting: Sports Medicine

## 2023-11-15 ENCOUNTER — Other Ambulatory Visit: Payer: Self-pay | Admitting: Neurology

## 2023-11-15 DIAGNOSIS — G43019 Migraine without aura, intractable, without status migrainosus: Secondary | ICD-10-CM

## 2023-11-18 ENCOUNTER — Other Ambulatory Visit: Payer: Self-pay

## 2023-12-02 ENCOUNTER — Ambulatory Visit: Payer: No Typology Code available for payment source | Admitting: Neurology

## 2023-12-02 ENCOUNTER — Encounter: Payer: Self-pay | Admitting: Neurology

## 2023-12-02 VITALS — BP 150/77 | HR 57 | Ht 62.0 in | Wt 226.0 lb

## 2023-12-02 DIAGNOSIS — R030 Elevated blood-pressure reading, without diagnosis of hypertension: Secondary | ICD-10-CM

## 2023-12-02 DIAGNOSIS — G4733 Obstructive sleep apnea (adult) (pediatric): Secondary | ICD-10-CM

## 2023-12-02 DIAGNOSIS — G43019 Migraine without aura, intractable, without status migrainosus: Secondary | ICD-10-CM | POA: Diagnosis not present

## 2023-12-02 MED ORDER — ZONISAMIDE 25 MG PO CAPS
50.0000 mg | ORAL_CAPSULE | Freq: Every day | ORAL | 1 refills | Status: AC
Start: 2023-12-02 — End: ?

## 2023-12-02 NOTE — Progress Notes (Signed)
 Subjective:    Patient ID: Stephanie Herrera is a 53 y.o. female.  HPI    Interim history:   Stephanie Herrera is a 53 year old female with an underlying medical history of reflux disease, kidney stones, vitamin D  deficiency, hyperlipidemia, migraine headaches, obstructive sleep apnea, obesity with s/p gastric bypass in April 2021, who presents for follow-up consultation of her migraine headaches.  The patient is unaccompanied today.  I last saw her in November 2024, at which time she reported working on weight loss, we talked about her mild sleep apnea as determined by her home sleep test in August 2024.  She was originally going to pursue a dental device but did not end up getting one due to cost.  She was on zonisamide  for migraine prevention and was supposed to get new eyeglasses.  She was on Nurtec as needed but had tried it only once.  She overall felt better.   Today, 12/02/2023: She continues to have recurrent headaches but she reports that they are not always as severe as a migraine.  She is struggling with ongoing issues with her left ear, she had to have a second ear surgery about 4 weeks ago and still has soreness behind her ear and from the incisions, has struggled with recurrent infections, currently on antibiotic eardrops.  She also took oral antibiotics with other complications such as fungal infection resulting from it.  She had a tough time recently.  She feels that the zonisamide  is helpful but also causes her feeling sluggish and word finding difficulty, she is willing to reduce it.  She has not taken the Nurtec very often.  She has woken up with a headache and pursuing an oral appliance was cost prohibitive.   The patient's allergies, current medications, family history, past medical history, past social history, past surgical history and problem list were reviewed and updated as appropriate.    Previously:  05/27/2023: She reports doing better.  She delayed her zonisamide  start  because she had inner ear surgery.  She has an implant, surgical records are not available for my review in her chart.  She saw her optometrist in the interim at [facility] and reports that she was told to follow-up in 1 year.  She had a change in her eyeglass prescription from bifocals to only reading glasses.  The lumbar puncture itself was not bad but the blood patch was painful.  However, she did improve after the blood patch and did not have any migraines for weeks.  She tried Nurtec once which did not help as far she can recall.  She just recently increased her Zonegran  to 75 mg at bedtime.  She tolerates it, no major side effects.  She is motivated to continue with treatment.  She is working on weight loss, she hydrates well, no daily caffeine.  Her tinnitus is better.  She still has some pulsatile tinnitus.   I saw her on 02/20/2023, at which time she reported a several year history of migraine headaches.  She reported a left pulsatile tinnitus for years.  She had seen ENT.  She was supposed to have a tympanoplasty and mastoidectomy.   She had an MRI of the internal auditory canals and posterior fossa with and without contrast through [facility] on 02/20/2023 and I reviewed the results:     IMPRESSION:  1. No mass or abnormal enhancement of the visualized cranial nerves.  2.  LEFT mastoid and middle ear effusion. No evidence of intracranial extension.  I suggested we proceed with additional testing including a brain MRI, reevaluation of her obstructive sleep apnea with a home sleep test.  She was also advised to proceed with a lumbar puncture and pursue a formal eye examination with ophthalmology.     Her lumbar puncture from 03/26/2023 showed a borderline opening pressure of 20 cm.   She had a brain MRI with and without contrast on 03/13/2023 and I reviewed the results:     IMPRESSION: MRI scan of the brain with and without contrast showing only minor nonspecific subcortical white matter  hyperintensities with the differential discussed above.  Incidental findings of left mastoid effusion and partially empty sella and noted.  Likely no significant change compared with previous outside MRI report from 02/20/2023.   She required a blood patch after her lumbar puncture as she developed a post LP headache.   She had a home sleep test through our office on 03/10/2023 which showed mild obstructive sleep apnea with an AHI of 7.8/h, O2 nadir 86%.  She was advised about her home sleep test results and opted to pursue a dental device through dentistry.  I made a referral to Dr. Abel Abelson.     She was advised to start zonisamide  for migraine prevention and Nurtec as needed for acute migraines.     02/20/2023: (She) reports recurrent migraines for years.  Sometimes she has associated blurry vision and nausea, she usually takes ibuprofen as needed.  She has had left-sided pulsatile tinnitus for years.  She started having ear pain in June and had a severe infection with copious drainage.  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  She is scheduled for a ear MRI today.  She will eventually have tympanoplasty and mastoidectomy on the left as well as ossicular chain reconstruction per ENT.  She has blurry vision when she has a migraine, she tries to avoid taking ibuprofen.  She admits that she does not always hydrate well with water .  She is a non-smoker and drinks alcohol rarely, limits her coffee to usually 1 cup/day.  She has not had any one-sided weakness or numbness or tingling.  She denies any loss of vision.  She had a corneal abrasion on the right eye recently.  She [facility] to [facility].   Of note, I had evaluated her for obstructive sleep apnea in November 2020.  She had a home sleep test on 06/15/2019, which showed  moderate/near-severe obstructive sleep apnea with a total AHI of 29.3/hour and O2 nadir of 78%.  She was advised to proceed  with home AutoPap therapy.  She did not have any subsequent follow-up appointments.  She reports that she stopped using her AutoPap in 2021 after her bariatric surgery, she reports significant weight loss of approximately 100 pounds since her surgery.      05/28/2019: Ms. Quebedeaux is a 53 year old right-handed woman with an underlying medical history of migraine headaches, hyperlipidemia, vitamin D  deficiency, status post right knee surgery, status post left foot surgery, and morbid obesity with a BMI of over 50, who reports snoring and excessive daytime somnolence. I reviewed your office note from 05/07/2019, which you kindly included. She is being evaluated for bariatric surgery.  Her Epworth sleepiness score is 12 out of 24 today, fatigue severity score is 49 out of 63.  She lives with her fianc.  She has 1 child, 33 yo son.  She is a non-smoker and drinks alcohol  occasionally, once or twice a month, caffeine daily in the form of coffee, up to 3 or 4/day on average, she admits that she does not drink a whole lot of fluids.  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  She has been working from home since before COVID-19.  She does not wake up rested.  She tries to be in bed between 8 and 9 and rise time is around 7.  Nevertheless, she is tired during the day, she wakes up in the middle of the night sometimes with a sharp headache and sometimes in the morning.  The headache is short-lived.  She had an eye examination in February 2020 and is due next February.  She has a dog in the household, is not aware of any family history of OSA.  Had a tonsillectomy and adenoidectomy as a child as she snored loudly. She had a CT scan of the temporal bone without contrast through [facility] on 01/01/2023 and I have reviewed the results:  IMPRESSION:   1. Fluid, mostly low density, within the left mastoid air cells and middle ear. Cholesteatoma less likely. (Is  there an abnormal otoscopic exam--white reflex?)  2. Possible areas of dehiscent in the floor of the left middle cranial fossa and right anterior cranial fossa as above. Enlarged sella turcica, small ventricles, sulci spaces and basilar cisterns. Correlate for history that might suggest IIH, and with funduscopic exam and/or lumbar puncture.    She has not been seen by ophthalmology.   Her Past Medical History Is Significant For: Past Medical History:  Diagnosis Date   Achilles rupture, near complete, right 2008   Avitaminosis D 03/02/2014   GERD (gastroesophageal reflux disease)    History of kidney stones    HLD (hyperlipidemia) 06/18/2012   Migraine    Painful orthopaedic hardware (HCC) removal, left foot    Sleep apnea    cpap    Her Past Surgical History Is Significant For: Past Surgical History:  Procedure Laterality Date   FOOT SURGERY Left 11/2013   Dr Rosebud Confer, tendon repair and lowered arch  ankle involved also   GASTRIC ROUX-EN-Y  11/09/2019   Procedure: LAPAROSCOPIC ROUX-EN-Y GASTRIC BYPASS WITH UPPER ENDOSCOPY;  Surgeon: Aldean Hummingbird, MD;  Location: [facility];  Service: General;;   HARDWARE REMOVAL Left 05/19/2015   Procedure: LEFT FOOT REMOVAL DEEP IMPLANT HARDWARE FROM CALCANEOUS AND FIRST METATARSAL;  Surgeon: Amada Backer, MD;  Location: [facility];  Service: Orthopedics;  Laterality: Left;   INNER EAR SURGERY Left    KNEE ARTHROSCOPY Right    TONSILLECTOMY     and adneoids    Her Family History Is Significant For: Family History  Problem Relation Age of Onset   Hyperlipidemia Mother    Lung cancer Father    Hemophilia Father    HIV Father        from transfusion   Sleep apnea Neg Hx    Migraines Neg Hx     Her Social History Is Significant For: Social History   Socioeconomic History   Marital status: Single    Spouse name: Not on file   Number of children: 1   Years of education: 14   Highest education level: Not on file  Occupational  History   Not on file  Tobacco Use   Smoking status: Never   Smokeless tobacco: Never  Vaping Use   Vaping status: Never Used  Substance and Sexual  Activity   Alcohol use: Not Currently    Alcohol/week: 0.0 - 1.0 standard drinks of alcohol    Comment: social; none since 01/11/23   Drug use: No   Sexual activity: Not Currently    Birth control/protection: None  Other Topics Concern   Not on file  Social History Narrative   Caffeine: 2 cups/day   Pt works    Pt lives with family    Social Drivers of Corporate investment banker Strain: Low Risk  (08/09/2023)   Received from [facility]   Overall Financial Resource Strain (CARDIA)    Difficulty of Paying Living Expenses: Not hard at all  Food Insecurity: No Food Insecurity (08/09/2023)   Received from [facility]   Hunger Vital Sign    Worried About Running Out of Food in the Last Year: Never true    Ran Out of Food in the Last Year: Never true  Transportation Needs: No Transportation Needs (08/09/2023)   Received from [facility] - Transportation    Lack of Transportation (Medical): No    Lack of Transportation (Non-Medical): No  Physical Activity: Sufficiently Active (12/25/2022)   Received from [facility]   Exercise Vital Sign    Days of Exercise per Week: 2 days    Minutes of Exercise per Session: 150+ min  Stress: No Stress Concern Present (12/25/2022)   Received from [facility] - Occupational Stress Questionnaire    Feeling of Stress : Only a little  Social Connections: Moderately Integrated (12/25/2022)   Received from [facility]   Social Network    How would you rate your social network (family, work, friends)?: Adequate participation with social networks    Her Allergies Are:  Allergies  Allergen Reactions   Hydrocodone  Itching    She is able to tolerate hydromorphone     Iodinated Contrast Media Hives, Shortness Of Breath and Swelling    Other  Reaction(s): Not available   Simvastatin     Jaundice  :   Her Current Medications Are:  Outpatient Encounter Medications as of 12/02/2023  Medication Sig   CALCIUM PO Take 500 mg by mouth in the morning and at bedtime.   Multiple Vitamins-Minerals (MULTIVITAMIN WITH MINERALS) tablet Take 1 tablet by mouth daily. chewable   Rimegepant Sulfate (NURTEC) 75 MG TBDP Take 1 tablet (75 mg total) by mouth once as needed for up to 1 dose (migraine). Max 1 tablet in 24 hours.   triamcinolone  cream (KENALOG) 0.1 % As needed   zonisamide  (ZONEGRAN ) 25 MG capsule TAKE 3 CAPSULES (75 MG TOTAL) BY MOUTH AT BEDTIME. THIS IS THE FINAL DOSE, FOLLOW INSTRUCTIONS PROVIDED.   No facility-administered encounter medications on file as of 12/02/2023.  :  Review of Systems:  Out of a complete 14 point review of systems, all are reviewed and negative with the exception of these symptoms as listed below:  Review of Systems  Neurological:        Pt here for migraines f/u Pt states 1 migraine in last month Pt states Zonisamide  makes her have brain fog Pt states woke up this am with headache     Objective:  Neurological Exam  Physical Exam Physical Examination:   Vitals:   12/02/23 0939  BP: (!) 150/77  Pulse: (!) 57    General Examination: The patient is a very pleasant 53 y.o. female in no acute distress. She appears well-developed and well-nourished and well groomed.  HEENT: Normocephalic, atraumatic, pupils are equal, round and reactive to light, no photophobia, corrective eyeglasses in place.  Hearing is mildly impaired on the left.  Healing scar behind left ear. Speech is clear with no dysarthria noted. There is no hypophonia. There is no lip, neck/head, jaw or voice tremor. Neck is supple with full range of passive and active motion. There are no carotid bruits on auscultation. Oropharynx exam reveals: mild mouth dryness, adequate dental hygiene and mild airway crowding. Tongue protrudes centrally  and palate elevates symmetrically.    Chest: Clear to auscultation without wheezing, rhonchi or crackles noted.   Heart: S1+S2+0, regular and normal without murmurs, rubs or gallops noted.    Abdomen: Soft, non-tender and non-distended.   Extremities: There is nonpitting puffiness noted in the ankle areas.      Skin: Warm and dry without trophic changes noted.    Musculoskeletal: exam reveals no obvious joint deformities.    Neurologically:  Mental status: The patient is awake, alert and oriented in all 4 spheres. Her immediate and remote memory, attention, language skills and fund of knowledge are appropriate. There is no evidence of aphasia, agnosia, apraxia or anomia. Speech is clear with normal prosody and enunciation. Thought process is linear. Mood is normal and affect is normal.  Cranial nerves II - XII are as described above under HEENT exam.  Motor exam: Normal bulk, strength and tone is noted.  No postural tremor or action tremor.  No intention tremor.     Fine motor skills and coordination: Grossly intact.   Cerebellar testing: No dysmetria or intention tremor. There is no truncal or gait ataxia.  Sensory exam: intact to light touch in the upper and lower extremities. Reflexes 1+. Romberg negative.  Gait, station and balance: She stands easily. No veering to one side is noted. No leaning to one side is noted. Posture is age-appropriate and stance is narrow based. Gait shows normal stride length and normal pace. No problems turning are noted. Normal tandem walk.   Assessment and Plan:  In summary, KERISHA GOUGHNOUR is a 53 year old female with an underlying medical history of reflux disease, kidney stones, vitamin D  deficiency, hyperlipidemia, migraine headaches, obstructive sleep apnea, obesity with s/p gastric bypass in April 2021, who presents for follow-up consultation of her migraine headaches.  She was found to have an enlarged sella turcica as an incidental finding on a  recent head CT scan to evaluate her temporal bones.  She has since then undergone left inner ear surgery, and had to have another subsequent surgery.    Her lumbar puncture from 03/26/2023 showed a borderline opening pressure of 20 cm.   She had a brain MRI with and without contrast on 03/13/2023 showed minor nonspecific subcortical white matter hyperintensities, left mastoid effusion and partially empty sella.   She required a blood patch after her lumbar puncture as she developed a post LP headache.   She had a home sleep test through our office on 03/10/2023 which showed mild obstructive sleep apnea with an AHI of 7.8/h, O2 nadir 86%.  She was advised about her home sleep test results and opted to pursue a dental device through dentistry.  I made a referral to Dr. Abel Abelson.  She did not end up pursuing a dental device due to cost.  She continues to work on weight loss.   She has been on zonisamide  for migraine prevention, currently on 75 mg strength once daily in the evening.  She would like to reduce  it, we will go down to 50 mg at bedtime.  She can utilize Nurtec as needed.  She may benefit from changing from eyeglasses to contact lenses as she has irritation of her left ear since her latest surgery.  She wakes up with headaches and we talked about potentially utilizing AutoPap.  I would recommend waiting until her ear has healed a little more.  She is encouraged to give us  an update her MyChart message in about a month.  She is reminded to stay better hydrated with water .  She is advised to follow-up routinely in this clinic in 6 months.  I spent 30 minutes in total face-to-face time and in reviewing records during pre-charting, more than 50% of which was spent in counseling and coordination of care, reviewing test results, reviewing medications and treatment regimen and/or in discussing or reviewing the diagnosis of recurrent migraines, OSA, the prognosis and treatment options. Pertinent laboratory  and imaging test results that were available during this visit with the patient were reviewed by me and considered in my medical decision making (see chart for details).

## 2023-12-02 NOTE — Patient Instructions (Signed)
 It was nice to see you again today.  I am sorry you are still struggling with your left ear problem.  I hope you can get contact lenses soon so your eyeglasses do not push on your ear. We can consider AutoPap therapy once you have healed better from your ear surgery, give us  an update in about 4 weeks by MyChart message or phone call.  We can consider starting you on PAP therapy at the time. We will reduce the zonisamide  to 50 mg at bedtime. Continue with Nurtec as needed for acute migraines. Try to hydrate better with water , 8 cups/day are generally recommended, 8 ounce size each.

## 2024-02-04 NOTE — Progress Notes (Unsigned)
    [facility] [address] [place] [address] Phone: [phone]   Assessment and Plan:     There are no diagnoses linked to this encounter.  ***   Pertinent previous records reviewed include ***    Follow Up: ***     Subjective:   I, Eleaner Dibartolo, am serving as a Neurosurgeon for Doctor Morene Mace  Chief Complaint: right elbow pain   HPI:   02/05/2024 Patient is a 53 year old female with right elbow pain. Patient states   Relevant Historical Information: ***  Additional pertinent review of systems negative.   Current Outpatient Medications:    CALCIUM PO, Take 500 mg by mouth in the morning and at bedtime., Disp: , Rfl:    Multiple Vitamins-Minerals (MULTIVITAMIN WITH MINERALS) tablet, Take 1 tablet by mouth daily. chewable, Disp: , Rfl:    Rimegepant Sulfate (NURTEC) 75 MG TBDP, Take 1 tablet (75 mg total) by mouth once as needed for up to 1 dose (migraine). Max 1 tablet in 24 hours., Disp: 10 tablet, Rfl: 3   triamcinolone  cream (KENALOG) 0.1 %, As needed, Disp: , Rfl:    zonisamide  (ZONEGRAN ) 25 MG capsule, Take 2 capsules (50 mg total) by mouth at bedtime., Disp: 180 capsule, Rfl: 1   Objective:     There were no vitals filed for this visit.    There is no height or weight on file to calculate BMI.    Physical Exam:    ***   Electronically signed by:  Odis Mace [facility] 7:35 AM 02/04/24

## 2024-02-05 ENCOUNTER — Ambulatory Visit (INDEPENDENT_AMBULATORY_CARE_PROVIDER_SITE_OTHER): Admitting: Sports Medicine

## 2024-02-05 VITALS — HR 88 | Ht 62.0 in | Wt 227.0 lb

## 2024-02-05 DIAGNOSIS — M7021 Olecranon bursitis, right elbow: Secondary | ICD-10-CM

## 2024-02-05 NOTE — Patient Instructions (Signed)
 Voltaren gel over areas of pain  As needed follow up

## 2024-06-03 ENCOUNTER — Ambulatory Visit: Admitting: Neurology

## 2024-06-03 ENCOUNTER — Encounter: Payer: Self-pay | Admitting: Neurology

## 2024-06-10 ENCOUNTER — Encounter (HOSPITAL_COMMUNITY): Payer: Self-pay | Admitting: *Deleted
